# Patient Record
Sex: Female | Born: 1945 | Race: White | Hispanic: No | Smoking: Former smoker
Health system: Southern US, Community
[De-identification: ages and names within clinical notes are randomized; demographics above are authoritative.]

## PROBLEM LIST (undated history)

## (undated) DIAGNOSIS — M1990 Unspecified osteoarthritis, unspecified site: Secondary | ICD-10-CM

## (undated) DIAGNOSIS — H9192 Unspecified hearing loss, left ear: Secondary | ICD-10-CM

## (undated) DIAGNOSIS — K5790 Diverticulosis of intestine, part unspecified, without perforation or abscess without bleeding: Secondary | ICD-10-CM

## (undated) DIAGNOSIS — M797 Fibromyalgia: Secondary | ICD-10-CM

## (undated) DIAGNOSIS — E039 Hypothyroidism, unspecified: Secondary | ICD-10-CM

## (undated) DIAGNOSIS — I82409 Acute embolism and thrombosis of unspecified deep veins of unspecified lower extremity: Secondary | ICD-10-CM

## (undated) DIAGNOSIS — M5126 Other intervertebral disc displacement, lumbar region: Secondary | ICD-10-CM

## (undated) DIAGNOSIS — B1910 Unspecified viral hepatitis B without hepatic coma: Secondary | ICD-10-CM

## (undated) DIAGNOSIS — M533 Sacrococcygeal disorders, not elsewhere classified: Secondary | ICD-10-CM

## (undated) HISTORY — DX: Diverticulosis of intestine, part unspecified, without perforation or abscess without bleeding: K57.90

## (undated) HISTORY — PX: REDUCTION MAMMAPLASTY: SUR839

## (undated) HISTORY — PX: VARICOSE VEIN SURGERY: SHX832

## (undated) HISTORY — PX: LAPAROSCOPIC GASTRIC BANDING: SHX1100

## (undated) HISTORY — DX: Other intervertebral disc displacement, lumbar region: M51.26

## (undated) HISTORY — DX: Unspecified viral hepatitis B without hepatic coma: B19.10

## (undated) HISTORY — PX: DILATION AND CURETTAGE OF UTERUS: SHX78

## (undated) HISTORY — PX: BUNIONECTOMY: SHX129

## (undated) HISTORY — PX: BREAST EXCISIONAL BIOPSY: SUR124

## (undated) HISTORY — DX: Unspecified hearing loss, left ear: H91.92

## (undated) HISTORY — DX: Sacrococcygeal disorders, not elsewhere classified: M53.3

## (undated) HISTORY — PX: TONSILLECTOMY: SUR1361

---

## 1967-02-21 HISTORY — PX: BREAST BIOPSY: SHX20

## 1967-03-24 HISTORY — PX: EXCISION MORTON'S NEUROMA: SHX5013

## 1968-02-21 DIAGNOSIS — B1910 Unspecified viral hepatitis B without hepatic coma: Secondary | ICD-10-CM

## 1968-02-21 HISTORY — DX: Unspecified viral hepatitis B without hepatic coma: B19.10

## 1971-02-21 HISTORY — PX: VAGINAL HYSTERECTOMY: SUR661

## 1972-02-21 HISTORY — PX: OTHER SURGICAL HISTORY: SHX169

## 1988-02-21 DIAGNOSIS — M5126 Other intervertebral disc displacement, lumbar region: Secondary | ICD-10-CM

## 1988-02-21 HISTORY — DX: Other intervertebral disc displacement, lumbar region: M51.26

## 1989-02-20 DIAGNOSIS — M533 Sacrococcygeal disorders, not elsewhere classified: Secondary | ICD-10-CM

## 1989-02-20 HISTORY — DX: Sacrococcygeal disorders, not elsewhere classified: M53.3

## 1997-07-07 ENCOUNTER — Encounter: Admission: RE | Admit: 1997-07-07 | Discharge: 1997-07-07 | Payer: Self-pay | Admitting: Family Medicine

## 1998-02-01 ENCOUNTER — Encounter: Admission: RE | Admit: 1998-02-01 | Discharge: 1998-02-01 | Payer: Self-pay | Admitting: Family Medicine

## 1998-02-02 ENCOUNTER — Encounter: Admission: RE | Admit: 1998-02-02 | Discharge: 1998-02-02 | Payer: Self-pay | Admitting: Family Medicine

## 1998-06-21 HISTORY — PX: CHOLECYSTECTOMY: SHX55

## 1998-06-28 ENCOUNTER — Emergency Department (HOSPITAL_COMMUNITY): Admission: EM | Admit: 1998-06-28 | Discharge: 1998-06-28 | Payer: Self-pay | Admitting: Emergency Medicine

## 1998-06-29 ENCOUNTER — Observation Stay (HOSPITAL_COMMUNITY): Admission: RE | Admit: 1998-06-29 | Discharge: 1998-06-30 | Payer: Self-pay | Admitting: General Surgery

## 1999-03-09 ENCOUNTER — Encounter: Admission: RE | Admit: 1999-03-09 | Discharge: 1999-03-09 | Payer: Self-pay | Admitting: Family Medicine

## 1999-03-30 ENCOUNTER — Encounter: Admission: RE | Admit: 1999-03-30 | Discharge: 1999-03-30 | Payer: Self-pay | Admitting: Family Medicine

## 1999-09-05 ENCOUNTER — Ambulatory Visit (HOSPITAL_COMMUNITY): Admission: RE | Admit: 1999-09-05 | Discharge: 1999-09-05 | Payer: Self-pay | Admitting: *Deleted

## 1999-09-05 ENCOUNTER — Encounter: Admission: RE | Admit: 1999-09-05 | Discharge: 1999-09-05 | Payer: Self-pay | Admitting: Family Medicine

## 1999-09-09 ENCOUNTER — Encounter: Admission: RE | Admit: 1999-09-09 | Discharge: 1999-09-09 | Payer: Self-pay | Admitting: Sports Medicine

## 1999-09-20 ENCOUNTER — Encounter: Admission: RE | Admit: 1999-09-20 | Discharge: 1999-09-20 | Payer: Self-pay | Admitting: Family Medicine

## 1999-09-27 ENCOUNTER — Encounter: Admission: RE | Admit: 1999-09-27 | Discharge: 1999-09-27 | Payer: Self-pay | Admitting: Family Medicine

## 1999-09-27 ENCOUNTER — Ambulatory Visit (HOSPITAL_COMMUNITY): Admission: RE | Admit: 1999-09-27 | Discharge: 1999-09-27 | Payer: Self-pay | Admitting: Obstetrics & Gynecology

## 1999-10-04 ENCOUNTER — Encounter: Admission: RE | Admit: 1999-10-04 | Discharge: 1999-10-04 | Payer: Self-pay | Admitting: Family Medicine

## 1999-10-17 ENCOUNTER — Encounter: Admission: RE | Admit: 1999-10-17 | Discharge: 1999-10-17 | Payer: Self-pay | Admitting: Sports Medicine

## 1999-11-14 ENCOUNTER — Encounter: Admission: RE | Admit: 1999-11-14 | Discharge: 1999-11-14 | Payer: Self-pay | Admitting: Family Medicine

## 1999-12-14 ENCOUNTER — Encounter: Admission: RE | Admit: 1999-12-14 | Discharge: 1999-12-14 | Payer: Self-pay | Admitting: Family Medicine

## 2000-07-24 ENCOUNTER — Encounter: Admission: RE | Admit: 2000-07-24 | Discharge: 2000-07-24 | Payer: Self-pay | Admitting: Family Medicine

## 2001-07-31 ENCOUNTER — Encounter: Admission: RE | Admit: 2001-07-31 | Discharge: 2001-07-31 | Payer: Self-pay | Admitting: Family Medicine

## 2001-09-03 ENCOUNTER — Ambulatory Visit (HOSPITAL_COMMUNITY): Admission: RE | Admit: 2001-09-03 | Discharge: 2001-09-03 | Payer: Self-pay | Admitting: Family Medicine

## 2001-09-03 ENCOUNTER — Encounter: Admission: RE | Admit: 2001-09-03 | Discharge: 2001-09-03 | Payer: Self-pay | Admitting: Family Medicine

## 2001-10-09 ENCOUNTER — Encounter: Admission: RE | Admit: 2001-10-09 | Discharge: 2001-10-09 | Payer: Self-pay | Admitting: Family Medicine

## 2001-10-14 ENCOUNTER — Encounter (INDEPENDENT_AMBULATORY_CARE_PROVIDER_SITE_OTHER): Payer: Self-pay | Admitting: *Deleted

## 2001-10-14 ENCOUNTER — Encounter: Admission: RE | Admit: 2001-10-14 | Discharge: 2001-10-14 | Payer: Self-pay | Admitting: Family Medicine

## 2002-09-23 ENCOUNTER — Encounter: Admission: RE | Admit: 2002-09-23 | Discharge: 2002-09-23 | Payer: Self-pay | Admitting: Family Medicine

## 2002-12-16 ENCOUNTER — Encounter: Admission: RE | Admit: 2002-12-16 | Discharge: 2002-12-16 | Payer: Self-pay | Admitting: Sports Medicine

## 2002-12-18 ENCOUNTER — Encounter: Admission: RE | Admit: 2002-12-18 | Discharge: 2002-12-18 | Payer: Self-pay | Admitting: Sports Medicine

## 2003-03-19 ENCOUNTER — Encounter: Admission: RE | Admit: 2003-03-19 | Discharge: 2003-03-19 | Payer: Self-pay | Admitting: Family Medicine

## 2003-03-25 ENCOUNTER — Encounter: Admission: RE | Admit: 2003-03-25 | Discharge: 2003-03-25 | Payer: Self-pay | Admitting: Family Medicine

## 2003-07-22 DIAGNOSIS — K5790 Diverticulosis of intestine, part unspecified, without perforation or abscess without bleeding: Secondary | ICD-10-CM

## 2003-07-22 HISTORY — DX: Diverticulosis of intestine, part unspecified, without perforation or abscess without bleeding: K57.90

## 2003-10-16 ENCOUNTER — Encounter: Admission: RE | Admit: 2003-10-16 | Discharge: 2003-10-16 | Payer: Self-pay | Admitting: Family Medicine

## 2003-10-27 ENCOUNTER — Ambulatory Visit: Payer: Self-pay | Admitting: Family Medicine

## 2004-01-22 ENCOUNTER — Ambulatory Visit: Payer: Self-pay | Admitting: Family Medicine

## 2004-06-03 ENCOUNTER — Ambulatory Visit: Payer: Self-pay | Admitting: Family Medicine

## 2004-06-09 ENCOUNTER — Ambulatory Visit (HOSPITAL_COMMUNITY): Admission: RE | Admit: 2004-06-09 | Discharge: 2004-06-09 | Payer: Self-pay | Admitting: Plastic Surgery

## 2004-06-09 ENCOUNTER — Encounter (INDEPENDENT_AMBULATORY_CARE_PROVIDER_SITE_OTHER): Payer: Self-pay | Admitting: *Deleted

## 2004-06-09 ENCOUNTER — Ambulatory Visit (HOSPITAL_BASED_OUTPATIENT_CLINIC_OR_DEPARTMENT_OTHER): Admission: RE | Admit: 2004-06-09 | Discharge: 2004-06-09 | Payer: Self-pay | Admitting: Plastic Surgery

## 2004-11-29 ENCOUNTER — Ambulatory Visit: Payer: Self-pay | Admitting: Sports Medicine

## 2005-06-08 ENCOUNTER — Encounter: Admission: RE | Admit: 2005-06-08 | Discharge: 2005-08-03 | Payer: Self-pay | Admitting: Sports Medicine

## 2005-10-21 ENCOUNTER — Encounter (INDEPENDENT_AMBULATORY_CARE_PROVIDER_SITE_OTHER): Payer: Self-pay | Admitting: *Deleted

## 2005-10-21 LAB — CONVERTED CEMR LAB

## 2005-11-10 ENCOUNTER — Ambulatory Visit: Payer: Self-pay | Admitting: Family Medicine

## 2005-11-17 ENCOUNTER — Ambulatory Visit: Payer: Self-pay | Admitting: Family Medicine

## 2005-11-17 HISTORY — PX: BREAST REDUCTION SURGERY: SHX8

## 2005-11-17 HISTORY — PX: TOTAL KNEE ARTHROPLASTY: SHX125

## 2006-02-20 HISTORY — PX: KNEE ARTHROSCOPY: SUR90

## 2006-04-19 DIAGNOSIS — IMO0001 Reserved for inherently not codable concepts without codable children: Secondary | ICD-10-CM | POA: Insufficient documentation

## 2006-04-19 DIAGNOSIS — H9190 Unspecified hearing loss, unspecified ear: Secondary | ICD-10-CM | POA: Insufficient documentation

## 2006-04-19 DIAGNOSIS — E039 Hypothyroidism, unspecified: Secondary | ICD-10-CM | POA: Insufficient documentation

## 2006-04-19 DIAGNOSIS — E785 Hyperlipidemia, unspecified: Secondary | ICD-10-CM | POA: Insufficient documentation

## 2006-04-20 ENCOUNTER — Encounter (INDEPENDENT_AMBULATORY_CARE_PROVIDER_SITE_OTHER): Payer: Self-pay | Admitting: *Deleted

## 2006-12-31 ENCOUNTER — Encounter: Payer: Self-pay | Admitting: Family Medicine

## 2007-01-21 ENCOUNTER — Telehealth: Payer: Self-pay | Admitting: Family Medicine

## 2007-01-22 ENCOUNTER — Ambulatory Visit: Payer: Self-pay | Admitting: Family Medicine

## 2007-01-22 LAB — CONVERTED CEMR LAB
ALT: 16 units/L (ref 0–35)
AST: 16 units/L (ref 0–37)
Albumin: 4.2 g/dL (ref 3.5–5.2)
Alkaline Phosphatase: 96 units/L (ref 39–117)
BUN: 21 mg/dL (ref 6–23)
CO2: 27 meq/L (ref 19–32)
Calcium: 9.4 mg/dL (ref 8.4–10.5)
Chloride: 106 meq/L (ref 96–112)
Cholesterol: 200 mg/dL (ref 0–200)
Creatinine, Ser: 0.71 mg/dL (ref 0.40–1.20)
Glucose, Bld: 123 mg/dL — ABNORMAL HIGH (ref 70–99)
HCT: 39.4 % (ref 36.0–46.0)
HDL: 53 mg/dL (ref >39)
Hemoglobin: 12.4 g/dL (ref 12.0–15.0)
LDL Cholesterol: 114 mg/dL — ABNORMAL HIGH (ref 0–99)
MCHC: 31.5 g/dL (ref 30.0–36.0)
MCV: 88.7 fL (ref 78.0–100.0)
Platelets: 170 10*3/uL (ref 150–400)
Potassium: 4.2 meq/L (ref 3.5–5.3)
RBC: 4.44 M/uL (ref 3.87–5.11)
RDW: 14.5 % (ref 11.5–15.5)
Sodium: 143 meq/L (ref 135–145)
TSH: 0.993 microintl units/mL (ref 0.350–5.50)
Total Bilirubin: 0.5 mg/dL (ref 0.3–1.2)
Total CHOL/HDL Ratio: 3.8
Total Protein: 6.6 g/dL (ref 6.0–8.3)
Triglycerides: 164 mg/dL — ABNORMAL HIGH (ref <150)
VLDL: 33 mg/dL (ref 0–40)
WBC: 5.6 10*3/uL (ref 4.0–10.5)

## 2007-01-23 ENCOUNTER — Encounter: Payer: Self-pay | Admitting: Family Medicine

## 2007-01-25 ENCOUNTER — Ambulatory Visit: Payer: Self-pay | Admitting: Family Medicine

## 2007-03-19 ENCOUNTER — Encounter: Payer: Self-pay | Admitting: Family Medicine

## 2007-08-26 ENCOUNTER — Telehealth: Payer: Self-pay | Admitting: *Deleted

## 2007-09-23 ENCOUNTER — Encounter: Payer: Self-pay | Admitting: Family Medicine

## 2008-01-22 ENCOUNTER — Encounter: Payer: Self-pay | Admitting: Family Medicine

## 2008-01-22 ENCOUNTER — Ambulatory Visit: Payer: Self-pay | Admitting: Family Medicine

## 2008-01-22 LAB — CONVERTED CEMR LAB
ALT: 24 units/L (ref 0–35)
AST: 19 units/L (ref 0–37)
Albumin: 4.5 g/dL (ref 3.5–5.2)
Alkaline Phosphatase: 93 units/L (ref 39–117)
BUN: 21 mg/dL (ref 6–23)
CO2: 25 meq/L (ref 19–32)
Calcium: 9.7 mg/dL (ref 8.4–10.5)
Chloride: 103 meq/L (ref 96–112)
Cholesterol: 221 mg/dL — ABNORMAL HIGH (ref 0–200)
Creatinine, Ser: 0.71 mg/dL (ref 0.40–1.20)
Glucose, Bld: 119 mg/dL — ABNORMAL HIGH (ref 70–99)
HCT: 40.7 % (ref 36.0–46.0)
HDL: 44 mg/dL (ref >39)
Hemoglobin: 13.1 g/dL (ref 12.0–15.0)
Hgb A1c MFr Bld: 6.3 %
LDL Cholesterol: 99 mg/dL (ref 0–99)
MCHC: 32.2 g/dL (ref 30.0–36.0)
MCV: 90.2 fL (ref 78.0–100.0)
Platelets: 179 10*3/uL (ref 150–400)
Potassium: 4.3 meq/L (ref 3.5–5.3)
RBC: 4.51 M/uL (ref 3.87–5.11)
RDW: 14.6 % (ref 11.5–15.5)
Sodium: 139 meq/L (ref 135–145)
TSH: 3.253 microintl units/mL (ref 0.350–4.50)
Total Bilirubin: 0.6 mg/dL (ref 0.3–1.2)
Total CHOL/HDL Ratio: 5
Total Protein: 6.8 g/dL (ref 6.0–8.3)
Triglycerides: 392 mg/dL — ABNORMAL HIGH (ref <150)
VLDL: 78 mg/dL — ABNORMAL HIGH (ref 0–40)
WBC: 6.1 10*3/uL (ref 4.0–10.5)

## 2008-01-27 ENCOUNTER — Ambulatory Visit: Payer: Self-pay | Admitting: Family Medicine

## 2008-01-27 ENCOUNTER — Encounter: Payer: Self-pay | Admitting: Family Medicine

## 2008-01-27 DIAGNOSIS — I10 Essential (primary) hypertension: Secondary | ICD-10-CM | POA: Insufficient documentation

## 2008-03-06 ENCOUNTER — Encounter: Payer: Self-pay | Admitting: Family Medicine

## 2008-03-09 ENCOUNTER — Encounter: Admission: RE | Admit: 2008-03-09 | Discharge: 2008-06-07 | Payer: Self-pay | Admitting: Surgery

## 2008-03-16 ENCOUNTER — Encounter: Payer: Self-pay | Admitting: Family Medicine

## 2008-03-17 ENCOUNTER — Ambulatory Visit (HOSPITAL_COMMUNITY): Admission: RE | Admit: 2008-03-17 | Discharge: 2008-03-17 | Payer: Self-pay | Admitting: Surgery

## 2008-03-24 ENCOUNTER — Ambulatory Visit (HOSPITAL_COMMUNITY): Admission: RE | Admit: 2008-03-24 | Discharge: 2008-03-24 | Payer: Self-pay | Admitting: Surgery

## 2008-03-31 ENCOUNTER — Encounter (INDEPENDENT_AMBULATORY_CARE_PROVIDER_SITE_OTHER): Payer: Self-pay | Admitting: Gastroenterology

## 2008-03-31 ENCOUNTER — Ambulatory Visit (HOSPITAL_COMMUNITY): Admission: RE | Admit: 2008-03-31 | Discharge: 2008-03-31 | Payer: Self-pay | Admitting: Gastroenterology

## 2008-04-08 ENCOUNTER — Encounter: Payer: Self-pay | Admitting: Family Medicine

## 2008-05-08 ENCOUNTER — Encounter: Payer: Self-pay | Admitting: Family Medicine

## 2008-05-12 ENCOUNTER — Ambulatory Visit (HOSPITAL_COMMUNITY): Admission: RE | Admit: 2008-05-12 | Discharge: 2008-05-12 | Payer: Self-pay | Admitting: Surgery

## 2008-07-14 ENCOUNTER — Encounter: Admission: RE | Admit: 2008-07-14 | Discharge: 2008-10-12 | Payer: Self-pay | Admitting: Surgery

## 2008-07-15 ENCOUNTER — Encounter: Payer: Self-pay | Admitting: Family Medicine

## 2008-09-09 ENCOUNTER — Encounter: Payer: Self-pay | Admitting: Family Medicine

## 2008-10-16 ENCOUNTER — Telehealth: Payer: Self-pay | Admitting: Family Medicine

## 2008-10-16 ENCOUNTER — Emergency Department (HOSPITAL_COMMUNITY): Admission: EM | Admit: 2008-10-16 | Discharge: 2008-10-16 | Payer: Self-pay | Admitting: Family Medicine

## 2008-10-19 ENCOUNTER — Encounter (INDEPENDENT_AMBULATORY_CARE_PROVIDER_SITE_OTHER): Payer: Self-pay | Admitting: Family Medicine

## 2008-10-19 ENCOUNTER — Ambulatory Visit: Payer: Self-pay | Admitting: Family Medicine

## 2008-10-21 ENCOUNTER — Ambulatory Visit: Payer: Self-pay | Admitting: Family Medicine

## 2008-10-21 ENCOUNTER — Encounter: Payer: Self-pay | Admitting: Family Medicine

## 2008-10-21 LAB — CONVERTED CEMR LAB
BUN: 12 mg/dL (ref 6–23)
CO2: 25 meq/L (ref 19–32)
Calcium: 9 mg/dL (ref 8.4–10.5)
Chloride: 108 meq/L (ref 96–112)
Cholesterol: 130 mg/dL (ref 0–200)
Creatinine, Ser: 0.62 mg/dL (ref 0.40–1.20)
Glucose, Bld: 96 mg/dL (ref 70–99)
HDL: 33 mg/dL — ABNORMAL LOW (ref >39)
LDL Cholesterol: 50 mg/dL (ref 0–99)
Potassium: 3.9 meq/L (ref 3.5–5.3)
Sodium: 144 meq/L (ref 135–145)
Total CHOL/HDL Ratio: 3.9
Triglycerides: 233 mg/dL — ABNORMAL HIGH (ref <150)
VLDL: 47 mg/dL — ABNORMAL HIGH (ref 0–40)

## 2008-10-23 ENCOUNTER — Encounter: Payer: Self-pay | Admitting: Family Medicine

## 2008-10-27 ENCOUNTER — Telehealth: Payer: Self-pay | Admitting: Family Medicine

## 2008-11-12 ENCOUNTER — Encounter: Admission: RE | Admit: 2008-11-12 | Discharge: 2008-11-12 | Payer: Self-pay | Admitting: Surgery

## 2009-02-01 ENCOUNTER — Telehealth: Payer: Self-pay | Admitting: Family Medicine

## 2009-02-02 ENCOUNTER — Encounter: Payer: Self-pay | Admitting: Family Medicine

## 2009-02-09 ENCOUNTER — Telehealth (INDEPENDENT_AMBULATORY_CARE_PROVIDER_SITE_OTHER): Payer: Self-pay | Admitting: Family Medicine

## 2009-05-03 ENCOUNTER — Ambulatory Visit: Payer: Self-pay | Admitting: Family Medicine

## 2009-05-03 ENCOUNTER — Telehealth: Payer: Self-pay | Admitting: Family Medicine

## 2009-05-03 LAB — CONVERTED CEMR LAB
ALT: 16 units/L (ref 0–35)
AST: 17 units/L (ref 0–37)
Albumin: 4 g/dL (ref 3.5–5.2)
Alkaline Phosphatase: 80 units/L (ref 39–117)
BUN: 18 mg/dL (ref 6–23)
CO2: 27 meq/L (ref 19–32)
Calcium: 9.3 mg/dL (ref 8.4–10.5)
Chloride: 105 meq/L (ref 96–112)
Creatinine, Ser: 0.63 mg/dL (ref 0.40–1.20)
Direct LDL: 89 mg/dL
Glucose, Bld: 92 mg/dL (ref 70–99)
HCT: 37.1 % (ref 36.0–46.0)
Hemoglobin: 11.8 g/dL — ABNORMAL LOW (ref 12.0–15.0)
MCHC: 31.8 g/dL (ref 30.0–36.0)
MCV: 88.5 fL (ref 78.0–100.0)
Platelets: 158 10*3/uL (ref 150–400)
Potassium: 4.2 meq/L (ref 3.5–5.3)
RBC: 4.19 M/uL (ref 3.87–5.11)
RDW: 14.9 % (ref 11.5–15.5)
Sodium: 143 meq/L (ref 135–145)
TSH: 0.558 microintl units/mL (ref 0.350–4.500)
Total Bilirubin: 0.6 mg/dL (ref 0.3–1.2)
Total Protein: 6.1 g/dL (ref 6.0–8.3)
WBC: 4.8 10*3/uL (ref 4.0–10.5)

## 2009-05-07 ENCOUNTER — Encounter: Payer: Self-pay | Admitting: Family Medicine

## 2009-05-25 ENCOUNTER — Ambulatory Visit: Payer: Self-pay | Admitting: Family Medicine

## 2009-05-25 DIAGNOSIS — R634 Abnormal weight loss: Secondary | ICD-10-CM | POA: Insufficient documentation

## 2009-05-25 LAB — CONVERTED CEMR LAB
HCT: 41.8 % (ref 36.0–46.0)
Hemoglobin: 13 g/dL (ref 12.0–15.0)
Iron: 58 ug/dL (ref 42–145)
MCHC: 31.1 g/dL (ref 30.0–36.0)
MCV: 90.9 fL (ref 78.0–100.0)
Platelets: 194 10*3/uL (ref 150–400)
RBC: 4.6 M/uL (ref 3.87–5.11)
RDW: 14.9 % (ref 11.5–15.5)
Retic Ct Pct: 1 % (ref 0.4–3.1)
Saturation Ratios: 17 % — ABNORMAL LOW (ref 20–55)
TIBC: 338 ug/dL (ref 250–470)
UIBC: 280 ug/dL
WBC: 6.1 10*3/uL (ref 4.0–10.5)

## 2009-05-26 ENCOUNTER — Encounter: Payer: Self-pay | Admitting: Family Medicine

## 2009-06-04 ENCOUNTER — Encounter: Payer: Self-pay | Admitting: Family Medicine

## 2009-06-15 ENCOUNTER — Encounter: Payer: Self-pay | Admitting: Family Medicine

## 2009-07-10 ENCOUNTER — Encounter: Payer: Self-pay | Admitting: Family Medicine

## 2009-10-12 ENCOUNTER — Telehealth: Payer: Self-pay | Admitting: Family Medicine

## 2009-10-12 DIAGNOSIS — G4730 Sleep apnea, unspecified: Secondary | ICD-10-CM | POA: Insufficient documentation

## 2009-10-14 ENCOUNTER — Encounter: Payer: Self-pay | Admitting: *Deleted

## 2009-10-26 ENCOUNTER — Encounter: Payer: Self-pay | Admitting: Family Medicine

## 2009-10-26 ENCOUNTER — Ambulatory Visit (HOSPITAL_BASED_OUTPATIENT_CLINIC_OR_DEPARTMENT_OTHER): Admission: RE | Admit: 2009-10-26 | Discharge: 2009-10-26 | Payer: Self-pay | Admitting: Family Medicine

## 2009-10-30 ENCOUNTER — Ambulatory Visit: Payer: Self-pay | Admitting: Internal Medicine

## 2009-11-11 ENCOUNTER — Encounter: Payer: Self-pay | Admitting: Family Medicine

## 2010-01-08 ENCOUNTER — Encounter: Payer: Self-pay | Admitting: Family Medicine

## 2010-01-08 DIAGNOSIS — K5730 Diverticulosis of large intestine without perforation or abscess without bleeding: Secondary | ICD-10-CM | POA: Insufficient documentation

## 2010-03-22 NOTE — Assessment & Plan Note (Signed)
Summary: prob List rev    Past Medical History:    coccydynia 10/91    Hepatitis B 1970     L 4-5 HNP 11/90    Diverticulosis, colon

## 2010-03-22 NOTE — Miscellaneous (Signed)
Summary: re: prior auth  Clinical Lists Changes called pt lmom to return call. pt needs to call her insurance company and ask if sleep study needs to be prior approved and have insurance fax form to our office.Tessie Fass CMA  October 14, 2009 3:54 PM  spoke with pt she is going to call insurance and let us know if the sleep study needs prior auth and have them fax form.Molly Maduro [facility] CMA  October 14, 2009 4:52 PM    Appended Document: re: prior auth sleep study DOES NOT need prior approval per pt. will fax order to sleep center.  Appended Document: re: prior auth appt at sleep center is 11/11/09 at 8pm. LM for pt to call back so I can tell her this  Appended Document: re: prior auth pt states she already knows about this appt

## 2010-03-22 NOTE — Letter (Signed)
Summary: Results Follow-up Letter  [facility]  [address]   [place], [place] [address]   Phone: [phone]  Fax: [phone]    05/07/2009  [address] [place], [place]  [address]  Dear Ms. Hett,   The following are the results of your recent test(s): Patient: Jean Dean Your labs are normal except for the borderline anemia which we can discuss when you come in. Tests: (1) CBC NO Diff (Complete Blood Count) (10000)   Order Note: FASTING   WBC                       4.8 K/uL                    4.0-10.5   RBC                       4.19 MIL/uL                 3.87-5.11   Hemoglobin           [L]  11.8 g/dL                   03.4-74.2   Hematocrit                37.1 %                      36.0-46.0   MCV                       88.5 fL                     78.0-100.0 ! MCH                       28.2 pg                     26.0-34.0   MCHC                      31.8 g/dL                   59.5-63.8   RDW                       14.9 %                      11.5-15.5   Platelet Count            158 K/uL                    150-400     *** Please note the addition of MCH. ***  Tests: (2) Comprehensive Metabolic Panel (75643)   Sodium                    143 mEq/L                   135-145   Potassium                 4.2 mEq/L                   3.5-5.3   Chloride  105 mEq/L                   96-112   CO2                       27 mEq/L                    19-32   Glucose                   92 mg/dL                    16-10   BUN                       18 mg/dL                    9-60   Creatinine                0.63 mg/dL                  0.40-1.20   Bilirubin, Total          0.6 mg/dL                   4.5-4.0   Alkaline Phosphatase      80 U/L                      39-117   AST/SGOT                  17 U/L                      0-37   ALT/SGPT                  16 U/L                      0-35   Total Protein             6.1 g/dL                     9.8-1.1   Albumin                   4.0 g/dL                    9.1-4.7   Calcium                   9.3 mg/dL                   8.2-95.6  Tests: (3) TSH (23280)   TSH                       0.558 uIU/mL                0.350-4.500     *** Please note change in reference ranges for ages 66W to 29Y. ***     ***Test methodology is 3rd generation TSH***  Tests: (4) LDL, Direct (21308)   LDL, Direct               89 mg/dL    Document Creation Date: 05/03/2009 9:40 PM _______________________________________________________________________  Reported date-time: 05/03/2009 21:41  Sincerely,  Zachery Dauer MD [facility]           Appended Document: Results Follow-up Letter mailed.

## 2010-03-22 NOTE — Miscellaneous (Signed)
Summary: Sleep study results  Clinical Lists Changes  Sent email:  Sent sleep study report. No desat below 90% and not enough sleep interruptions to titrate mask. Asked her to try not sleeping on back and to see me if nasal congestion. If no improvement will need full nights sleep study for mask titration.

## 2010-03-22 NOTE — Progress Notes (Signed)
Summary: Labs  Phone Note Call from Patient Call back at Home Phone [phone]   Reason for Call: Talk to Nurse Summary of Call: pt wants to come in for labs so he can get a refill on her meds, refill was denied by MD due to labs. Pt coming now, wants to be sure there will be orders in her chart. Initial call taken by: Knox Royalty,  May 03, 2009 9:34 AM  New Problems: ENCOUNTER FOR LONG-TERM USE OF OTHER MEDICATIONS (ICD-V58.69)   New Problems: ENCOUNTER FOR LONG-TERM USE OF OTHER MEDICATIONS (ICD-V58.69)

## 2010-03-22 NOTE — Assessment & Plan Note (Signed)
Summary: Htn, edemakh   Vital Signs:  Patient profile:   65 year old female Height:      72.75 inches Weight:      214.8 pounds BMI:     28.64 Temp:     96.8 degrees F oral BP sitting:   112 / 65  (left arm) Cuff size:   regular  Vitals Entered By: San Morelle, SMA CC: F/u Pain Assessment Patient in pain? no        Primary Care Provider:  Zachery Dauer MD  CC:  F/u.  History of Present Illness: Recently returned from [place], [place] where she got a lot of mosquito bites.   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 She gets full quickly as expected and regurgitates if eats too rapidly. No diarrhea.   Hasn't been taking Calcium and Vitamin D.   Habits & Providers  Alcohol-Tobacco-Diet     Tobacco Status: never  Current Medications (verified): 1)  Levothroid 200 Mcg  Tabs (Levothyroxine Sodium) .... One Daily 2)  Lipitor 10 Mg Tabs (Atorvastatin Calcium) .... Take One Tablet Daily At Bedtime 3)  Paxil 20 Mg Tabs (Paroxetine Hcl) .... Take 1 Tablet By Mouth Once A Day 4)  Potassium Chloride Cr 10 Meq Cr-Caps (Potassium Chloride) .... One Daily With Hctz Prn 5)  Aspirin 325 Mg Tabs (Aspirin) .... Take One Tablet Daily 6)  Tums E-X 750 750 Mg Chew (Calcium Carbonate Antacid) .... Take 1 Tab Two Times A Day 7)  Vitamin D 400 Unit Tabs (Cholecalciferol) .... Take 2 Tabs Daily 8)  Furosemide 40 Mg Tabs (Furosemide) .... Take One Half To One Tab Daily As Needed  Allergies (verified): 1)  Biaxin (Clarithromycin)  Social History: Smoking Status:  never  Physical Exam  General:  Well, obvious weight loss Lungs:  Normal respiratory effort, chest expands symmetrically. Lungs are clear to auscultation, no crackles or wheezes. Heart:  Normal rate and regular rhythm. S1 and S2 normal without gallop, murmur, click, rub or other extra sounds. Extremities:  trace left pedal edema and trace right pedal edema.      Impression & Recommendations:  Problem # 1:  HYPERTENSION, BENIGN ESSENTIAL, MILD (ICD-401.1) At her request, changed HCTZ to furosemide 40 mg 1/2 once daily as needed for cyclic edema The following medications were removed from the medication list:    Hydrochlorothiazide 25 Mg Tabs (Hydrochlorothiazide) ..... One prn Her updated medication list for this problem includes:    Furosemide 40 Mg Tabs (Furosemide) .Marland Kitchen... Take one half to one tab daily as needed  Problem # 2:  DIARRHEA (ICD-787.91) resolved  Problem # 3:  ANEMIA (ICD-285.9)  Orders: Iron -[facility] ([address]) Iron Binding Cap (TIBC)-[facility] ([address]) Ferritin-[facility] [phone]) Retic-[facility] ([address]-69629) CBC-[facility] ([address])  Problem # 4:  HYPOTHYROIDISM, UNSPECIFIED (ICD-244.9) Replaced Her updated medication list for this problem includes:    Levothroid 200 Mcg Tabs (Levothyroxine sodium) ..... One daily  Problem # 5:  HYPERLIPIDEMIA (ICD-272.4)  Her updated medication list for this problem includes:    Lipitor 10 Mg Tabs (Atorvastatin calcium) .Marland Kitchen... Take one tablet daily at bedtime  Problem # 6:  LOSS OF WEIGHT (ICD-783.21) Therapeutic due to lap band. Consider increased risk of osteoporosis BMD  Complete Medication List: 1)  Levothroid 200 Mcg Tabs (Levothyroxine sodium) .... One daily 2)  Lipitor 10 Mg Tabs (Atorvastatin calcium) .... Take one tablet daily at bedtime 3)  Paxil 20 Mg Tabs (Paroxetine hcl) .Marland KitchenMarland KitchenMarland Kitchen  Take 1 tablet by mouth once a day 4)  Potassium Chloride Cr 10 Meq Cr-caps (Potassium chloride) .... One daily with hctz prn 5)  Aspirin 325 Mg Tabs (Aspirin) .... Take one tablet daily 6)  Tums E-x 750 750 Mg Chew (Calcium carbonate antacid) .... Take 1 tab two times a day 7)  Vitamin D 400 Unit Tabs (Cholecalciferol) .... Take 2 tabs daily 8)  Furosemide 40 Mg Tabs (Furosemide) .... Take one half to one tab daily as needed  Other Orders: [facility]- Est  Level 4 ([address]214) Vit D, 25 [facility] ([address]) Dexa scan  (Dexa scan) Pneumococcal Vaccine (09811) Admin 1st Vaccine (91478)  Patient Instructions: 1)  Please schedule a follow-up appointment in 6 months .  2)  I will send you your lab results.  Prescriptions: FUROSEMIDE 40 MG TABS (FUROSEMIDE) Take one half to one tab daily as needed  #30 x 11   Entered and Authorized by:   Zachery Dauer MD   Signed by:   Zachery Dauer MD on 05/25/2009   Method used:   Electronically to        [facility]. [phone]* (retail)       [address]       [place], [place]  [address]       Ph: [phone]       Fax: [phone]   RxID:   [id number]     Prevention & Chronic Care Immunizations   Influenza vaccine: Fluvax 3+  (11/23/2008)   Influenza vaccine due: 01/26/2009    Tetanus booster: 01/20/2001: Done.   Tetanus booster due: 01/21/2011    Pneumococcal vaccine: Pneumovax  (05/25/2009)    H. zoster vaccine: Not documented  Colorectal Screening   Hemoccult: Not documented    Colonoscopy: abnormal  (03/31/2008)   Colonoscopy due: 03/31/2018  Other Screening   Pap smear: Done.  (10/21/2005)   Pap smear due: 10/2008    Mammogram: Done.  (10/22/2003)   Mammogram due: 10/21/2004    DXA bone density scan: Not documented   DXA bone density action/deferral: Ordered  (05/25/2009)   Smoking status: never  (05/25/2009)  Lipids   Total Cholesterol: 130  (10/21/2008)   LDL: 50  (10/21/2008)   LDL Direct: 89  (05/03/2009)   HDL: 33  (10/21/2008)   Triglycerides: 233  (10/21/2008)    SGOT (AST): 17  (05/03/2009)   SGPT (ALT): 16  (05/03/2009)   Alkaline phosphatase: 80  (05/03/2009)   Total bilirubin: 0.6  (05/03/2009)    Lipid flowsheet reviewed?: Yes   Progress toward LDL goal: At goal  Hypertension   Last Blood Pressure: 112 / 65  (05/25/2009)   Serum creatinine: 0.63  (05/03/2009)   Serum potassium 4.2  (05/03/2009)    Hypertension flowsheet reviewed?: Yes   Progress toward BP goal: At  goal  Self-Management Support :   Personal Goals (by the next clinic visit) :      Personal blood pressure goal: 140/90  (05/25/2009)     Personal LDL goal: 130  (05/25/2009)    Hypertension self-management support: Not documented    Lipid self-management support: Not documented      Immunization History:  Influenza Immunization History:    Influenza:  fluvax 3+ (11/23/2008)  Immunizations Administered:  Pneumonia Vaccine:    Vaccine Type: Pneumovax    Site: left deltoid    Mfr: Merck    Dose: 0.5 ml    Route: IM  Given by: Tessie Fass CMA    Exp. Date: 01/20/2010    Lot #: [id number]    VIS given: 09/18/95 version given May 25, 2009.    Immunizations given and recorded during this visit PNEUMOVAX Pneumovax  VWU:JWJXB  LotNo:[id number]  ExpDt:01/20/2010  IM  left deltoid  by Tessie Fass CMA  VIS:09/18/95 version given May 25, 2009.

## 2010-03-22 NOTE — Assessment & Plan Note (Signed)
Summary: synthroid refill  Prescriptions: LEVOTHROID 200 MCG  TABS (LEVOTHYROXINE SODIUM) one daily  #30 Tablet x 1   Entered and Authorized by:   Zachery Dauer MD   Signed by:   Zachery Dauer MD on 05/03/2009   Method used:   Electronically to        [facility]. [phone]* (retail)       [address]       [place], [place]  [address]       Ph: [phone]       Fax: [phone]   RxID:   [id number]   Appended Document: synthroid refill Patient says she is only taking 10 mg of Lipitor, as instructed?   Clinical Lists Changes  Orders: Added new Test order of Direct LDL-[facility] [phone]) - Signed

## 2010-03-22 NOTE — Consult Note (Signed)
Summary: Sleep Study  Sleep Study   Imported By: Clydell Hakim 11/11/2009 15:48:39  _____________________________________________________________________  External Attachment:    Type:   Image     Comment:   External Document

## 2010-03-22 NOTE — Progress Notes (Signed)
Summary: request for sleep study  Phone Note Call from Patient   Caller: Patient Summary of Call: > This summer I spent a weekend sleeping in a room with several others  > and consensus was that i should get tested so I am asking you how I go  > about scheduling this. >  >  > Boneta Lucks   Follow-up for Phone Call        On Oct 08, 2009, at 8:45 AM, "Zachery Dauer" [email or web address]> wrote:  > If you tell me the symptoms, apneic episodes, snoring, daytime  > fatigue, headache, etc I can order a split study where you would spend  > the night in the sleep lab. If they demonstrated OSA they will fit you  > with a mask and determine the pressure setting so that the equipment  > can be ordered for your use. Glenice Ciccone Follow-up by: Zachery Dauer MD,  October 12, 2009 9:44 AM  Additional Follow-up for Phone Call Additional follow up Details #1::        I know I snore and my sibs say I do have some periods of apnea.  No fatigue. Occasional morning headaches  Boneta Lucks Additional Follow-up by: Zachery Dauer MD,  October 12, 2009 9:44 AM  New Problems: SLEEP APNEA (ICD-780.57)   New Problems: SLEEP APNEA (ICD-780.57)

## 2010-03-22 NOTE — Miscellaneous (Signed)
Summary: prophylaxis for [place] trip  Clinical Lists Changes She will see about getting Yellow Fever vaccine Medications: Added new medication of DOXYCYCLINE HYCLATE 100 MG CAPS (DOXYCYCLINE HYCLATE) Take one tab daily - Signed Rx of DOXYCYCLINE HYCLATE 100 MG CAPS (DOXYCYCLINE HYCLATE) Take one tab daily;  #43 x 0;  Signed;  Entered by: Zachery Dauer MD;  Authorized by: Zachery Dauer MD;  Method used: Electronically to [facility]. #[id number]*, [address] [place], [place], [place]  [address], Ph: [phone], Fax: [phone]    Prescriptions: DOXYCYCLINE HYCLATE 100 MG CAPS (DOXYCYCLINE HYCLATE) Take one tab daily  #43 x 0   Entered and Authorized by:   Zachery Dauer MD   Signed by:   Zachery Dauer MD on 07/10/2009   Method used:   Electronically to        [facility]. [phone]* (retail)       [address]       [place], [place]  [address]       Ph: [phone]       Fax: [phone]   RxID:   [id number]

## 2010-03-22 NOTE — Letter (Signed)
Summary: Results Follow-up Letter  [facility]  [address]   [place], [place] [address]   Phone: [phone]  Fax: [phone]    05/26/2009  [address] [place], [place]  [address]  Dear Ms. Maberry,   The following are the results of your recent test(s): Patient: Jean Dean  Tests: (1) Iron and IBC (2390)   Iron                      58 ug/dL                    57-846   UIBC                      280 ug/dL   TIBC                      338 ug/dL                   962-952   %SAT                 [L]  17 %                        20-55 Despite the slightly low saturation the ferritin, Hgb and MCV don't support iron deficiency, so no reason to take iron.   Tests: (2) CBC NO Diff (Complete Blood Count) (10000)   WBC                       6.1 K/uL                    4.0-10.5   RBC                       4.60 MIL/uL                 3.87-5.11   Hemoglobin                13.0 g/dL                   84.1-32.4   Hematocrit                41.8 %                      36.0-46.0   MCV                       90.9 fL                     78.0-100.0 ! MCH                       28.3 pg                     26.0-34.0   MCHC                      31.1 g/dL                   40.1-02.7   RDW                       14.9 %  11.5-15.5   Platelet Count            194 K/uL                    150-400     *** Please note the addition of MCH. ***  Tests: (3) Reticulocyte Count (RETIC) (10050)   % Retic                   1.0 %                       0.4-3.1   RBC                       4.60 MIL/uL                 3.87-5.11   ABS Retic                 46.0 K/uL                   19.0-186.0  Tests: (4) Ferritin ([address])   Ferritin                  90 ng/mL                    10-291  Tests: (5) Vitamin D (25-Hydroxy) (60[address])  Vitamin D (25-Hydroxy)                             35 ng/mL                    30-89  We'll give credit to the [place] sun for this  normal level. Document Creation Date: 05/26/2009 6:02 AM ___________________________________________________________  Sincerely,  Zachery Dauer MD [facility]           Appended Document: Results Follow-up Letter mailed.

## 2010-04-01 LAB — LIPID PANEL
HDL: 40 mg/dL (ref 35–70)
LDL Cholesterol: 72 mg/dL
LDl/HDL Ratio: 4

## 2010-04-01 LAB — BASIC METABOLIC PANEL
BUN: 16 mg/dL (ref 4–21)
Creatinine: 0.7 mg/dL (ref 0.5–1.1)
Glucose: 94 mg/dL
Potassium: 4.1 mmol/L (ref 3.4–5.3)

## 2010-04-01 LAB — HEPATIC FUNCTION PANEL
ALT: 23 U/L (ref 7–35)
AST: 21 U/L (ref 13–35)
Bilirubin, Total: 0.6 mg/dL

## 2010-04-01 LAB — CBC AND DIFFERENTIAL: WBC: 5.9 10^3/mL

## 2010-04-07 ENCOUNTER — Encounter: Payer: Self-pay | Admitting: Family Medicine

## 2010-05-02 ENCOUNTER — Encounter: Payer: Self-pay | Admitting: Home Health Services

## 2010-05-28 LAB — BASIC METABOLIC PANEL
BUN: 13 mg/dL (ref 6–23)
CO2: 25 mEq/L (ref 19–32)
Calcium: 9.1 mg/dL (ref 8.4–10.5)
Chloride: 102 mEq/L (ref 96–112)
Creatinine, Ser: 0.85 mg/dL (ref 0.4–1.2)
GFR calc Af Amer: >60 mL/min (ref >60)
GFR calc non Af Amer: >60 mL/min (ref >60)
Glucose, Bld: 95 mg/dL (ref 70–99)
Potassium: 3.3 mEq/L — ABNORMAL LOW (ref 3.5–5.1)
Sodium: 137 mEq/L (ref 135–145)

## 2010-05-28 LAB — STOOL CULTURE

## 2010-05-28 LAB — CLOSTRIDIUM DIFFICILE EIA: C difficile Toxins A+B, EIA: NEGATIVE

## 2010-05-28 LAB — CBC
HCT: 35.8 % — ABNORMAL LOW (ref 36.0–46.0)
Hemoglobin: 11.9 g/dL — ABNORMAL LOW (ref 12.0–15.0)
MCHC: 33.3 g/dL (ref 30.0–36.0)
MCV: 86.7 fL (ref 78.0–100.0)
Platelets: 167 10*3/uL (ref 150–400)
RBC: 4.13 MIL/uL (ref 3.87–5.11)
RDW: 15.2 % (ref 11.5–15.5)
WBC: 6.7 10*3/uL (ref 4.0–10.5)

## 2010-05-31 ENCOUNTER — Other Ambulatory Visit: Payer: Self-pay | Admitting: Family Medicine

## 2010-05-31 NOTE — Telephone Encounter (Signed)
Refill request

## 2010-06-02 LAB — DIFFERENTIAL
Basophils Absolute: 0 10*3/uL (ref 0.0–0.1)
Basophils Relative: 1 % (ref 0–1)
Eosinophils Absolute: 0.2 10*3/uL (ref 0.0–0.7)
Eosinophils Relative: 3 % (ref 0–5)
Lymphocytes Relative: 27 % (ref 12–46)
Lymphs Abs: 1.8 10*3/uL (ref 0.7–4.0)
Monocytes Absolute: 0.4 10*3/uL (ref 0.1–1.0)
Monocytes Relative: 6 % (ref 3–12)
Neutro Abs: 4.4 10*3/uL (ref 1.7–7.7)
Neutrophils Relative %: 64 % (ref 43–77)

## 2010-06-02 LAB — COMPREHENSIVE METABOLIC PANEL
ALT: 26 U/L (ref 0–35)
AST: 23 U/L (ref 0–37)
Albumin: 4.1 g/dL (ref 3.5–5.2)
Alkaline Phosphatase: 93 U/L (ref 39–117)
BUN: 14 mg/dL (ref 6–23)
CO2: 29 mEq/L (ref 19–32)
Calcium: 9.6 mg/dL (ref 8.4–10.5)
Chloride: 104 mEq/L (ref 96–112)
Creatinine, Ser: 0.69 mg/dL (ref 0.4–1.2)
GFR calc Af Amer: >60 mL/min (ref >60)
GFR calc non Af Amer: >60 mL/min (ref >60)
Glucose, Bld: 127 mg/dL — ABNORMAL HIGH (ref 70–99)
Potassium: 3.9 mEq/L (ref 3.5–5.1)
Sodium: 140 mEq/L (ref 135–145)
Total Bilirubin: 0.9 mg/dL (ref 0.3–1.2)
Total Protein: 6.7 g/dL (ref 6.0–8.3)

## 2010-06-02 LAB — CBC
HCT: 39.4 % (ref 36.0–46.0)
Hemoglobin: 13.1 g/dL (ref 12.0–15.0)
MCHC: 33.2 g/dL (ref 30.0–36.0)
MCV: 88.9 fL (ref 78.0–100.0)
Platelets: 178 10*3/uL (ref 150–400)
RBC: 4.43 MIL/uL (ref 3.87–5.11)
RDW: 15 % (ref 11.5–15.5)
WBC: 6.8 10*3/uL (ref 4.0–10.5)

## 2010-06-02 LAB — GLUCOSE, CAPILLARY: Glucose-Capillary: 142 mg/dL — ABNORMAL HIGH (ref 70–99)

## 2010-07-01 ENCOUNTER — Telehealth: Payer: Self-pay | Admitting: Family Medicine

## 2010-07-01 NOTE — Telephone Encounter (Signed)
Email request for refills. Replied that she needs an office visit and labs because it has been longer than a year for both.

## 2010-07-05 NOTE — Op Note (Signed)
NAMEBRITANNI, Jean Dean           ACCOUNT NO.:  [id number]   MEDICAL RECORD NO.:  [id number]          PATIENT TYPE:  AMB   LOCATION:  ENDO                         FACILITY:  [facility]   PHYSICIAN:  Bernette Redbird, M.D.   DATE OF BIRTH:  24-May-1945   DATE OF PROCEDURE:  03/31/2008  DATE OF DISCHARGE:                               OPERATIVE REPORT   SURGEON:  Bernette Redbird, MD   PROCEDURE:  Colonoscopy with biopsy.   INDICATIONS:  This is a 65 year old female with history of several  colonic adenomas removed about 8 years ago, with most recent  surveillance colonoscopy, 5 years ago, having been negative.   FINDINGS:  Several diminutive polyps.  Scattered diverticulosis.   PROCEDURE:  The procedure was familiar to the patient from prior  examination and she provided written consent.  Sedation was Phenergan  12.5 mg IV, fentanyl 100 mcg IV, and Versed 7.5 mg IV without  arrhythmias, desaturation, or clinical instability.  The Pentax adult  video colonoscope was advanced around the colon to the terminal ileum,  using some external abdominal compression to control looping.  Pullback  was then performed.  The quality of prep was excellent, and it is felt  that all areas were well seen.  In the transverse colon, was a 2-mm  sessile polyp, removed by cold biopsy.  In the rectum, there were  several diminutive flat hyperplastic-appearing polyps, a couple of which  were biopsied for sampling purposes.  No large polyps, cancer, colitis,  or vascular ectasia were noted, but there was mild-to-moderate  diverticulosis scattered through much of the colon.  It was  predominantly in the left colon and sigmoid region.   Retroflexion in the rectum and re-inspection of the rectum were  unremarkable.  The patient tolerated the procedure well, and there were  no apparent complications.   IMPRESSION:  1. Diminutive colon polyps.  2. Scattered diverticulosis.   PLAN:  Await pathology.  In view of the  prior history of colonic  adenomas and the small size of the current polyps, I would anticipate a  surveillance examination in 5 years.           ______________________________  Bernette Redbird, M.D.     RB/MEDQ  D:  03/31/2008  T:  03/31/2008  Job:  [id number]   cc:   Deniece Portela A. Sheffield Slider, M.D.  Sandria Bales. Ezzard Standing, M.D.

## 2010-07-05 NOTE — Op Note (Signed)
NAMEJILLAYNE, WITTE           ACCOUNT NO.:  [id number]   MEDICAL RECORD NO.:  [id number]          PATIENT TYPE:  AMB   LOCATION:  DAY                          FACILITY[facility]  [facility]   PHYSICIAN:  Sandria Bales. Ezzard Standing, M.D.  DATE OF BIRTH:  1945/07/08   DATE OF PROCEDURE:  05/12/2008  DATE OF DISCHARGE:                               OPERATIVE REPORT   Date of Surgery ?   PREOPERATIVE DIAGNOSIS:  Morbid obesity (Weight of 271, BMI 35.8).   POSTOPERATIVE DIAGNOSIS:  Morbid obesity (weight 271, BMI of 35.8)   PROCEDURES:  Laparoscopic band with AP standard band, sewn to fascia.   SURGEON:  Dr. Sandria Bales. Newman.   FIRST ASSISTANT:  Dr. Wenda Low.   ANESTHESIA:  General endotracheal with approximately 20 mL of 0.25%  Marcaine.   COMPLICATIONS:  None.   INDICATIONS FOR PROCEDURE:  Ms. Crumbley  is a 65 year old white female  patient who sees Luretha Murphy, Family Nurse Practitioner for Zachery Dauer,  M.D..  She is interested in a the lap band.  She has completed our preop  bariatric program.   I discussed with her the indications, potential complications of lap  band surgery.  Potential complication include but not limited bleeding,  infection, slippage and erosion of the band and the possibility of open  surgery.   OPERATIVE NARRATIVE:  The patient placed in supine position.  Her  abdomen was prepped with CHG, sterilely draped.  She was given a gram of  Ancef at the initiation of procedure and a time-out was held identifying  the patient and procedure.   I accessed her abdominal cavity through a left upper quadrant with a 11-  mm OptiVu  Ethicon trocar. I placed for 4 additional trocars.  I placed  a 5 mm trocar in the subxiphoid location for the liver tract, I placed  in 15 mm trocar in the right upper quadrant, A 12 mm trocar to the right  of midline, A 12 mm left to midline for the camera.   The abdominal exploration revealed right and left lobes of liver  unremarkable.  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  the bowel that I could see  was unremarkable, omen covering part with omentum and the stomach was  unremarkable.  I introduced the [facility] liver retractor to retract the  left lobe of liver.  She actually had kind of a multi-lobulated left  lobe, a little bit hard to retract out of the way.  It was not so big,  it was just sort of floppy.   I got to the gastroesophageal junction, I passed the sizing tube down  and blew up 15 mL of air and pulled this back.  There was no evidence of  a hiatal hernia.  The esophagus lumen was then pulled back into the  esophagus.  I then created a space, I went to left  lobe of the  gastroesophageal junction and made a small window into the right of the  gastroesophageal junction along the right crus and passed the finger  dissector behind the gastroesophageal junction and threaded an AP  standard band around.  I then I passed the sizing tube back down.  I cinched the band over the  sizing tube.  It did not seem too tight.   The sizing tube was then removed.  I then placed 3-0 Ethilon sutures as  an imbricating sutures of the stomach over the lateral aspect of the  band and used tie knots to tie these down.   A photo was taken and placed in the chart showing the orientation of the  band.   The Silastic tube was removed out through the right paramedian incision.  There was no bleeding within the abdomen.  All the trocars were then  removed in turn with no visualized evidence of bleeding.  I created a  pocket and sewed the band reservoir down to the  abdominal wall.  It was sent to the fascia with four 2-0 Prolene  sutures.   The band device lay flat, the Silastic tubing went to the abdomen  without any obvious kink.  I then closed his tissues with 3-0 Vicryl  suture and the skin each site was closed with 5-0 Vicryl suture painted  with Benzoin and Steri-Strips.   The patient tolerated the procedure well and was  transported to recovery  room good condition.   PLAN:  The plan is to let her go home today.      Sandria Bales. Ezzard Standing, M.D.  Electronically Signed     DHN/MEDQ  D:  05/12/2008  T:  05/12/2008  Job:  [id number]   cc:   Deniece Portela A. Sheffield Slider, M.D.

## 2010-07-06 ENCOUNTER — Encounter: Payer: Self-pay | Admitting: Family Medicine

## 2010-07-07 ENCOUNTER — Encounter: Payer: Self-pay | Admitting: Family Medicine

## 2010-07-07 ENCOUNTER — Ambulatory Visit (INDEPENDENT_AMBULATORY_CARE_PROVIDER_SITE_OTHER): Payer: BC Managed Care – PPO | Admitting: Family Medicine

## 2010-07-07 VITALS — BP 120/80 | HR 80 | Ht 74.0 in | Wt 221.0 lb

## 2010-07-07 DIAGNOSIS — H9190 Unspecified hearing loss, unspecified ear: Secondary | ICD-10-CM

## 2010-07-07 DIAGNOSIS — R634 Abnormal weight loss: Secondary | ICD-10-CM

## 2010-07-07 DIAGNOSIS — E785 Hyperlipidemia, unspecified: Secondary | ICD-10-CM

## 2010-07-07 DIAGNOSIS — I10 Essential (primary) hypertension: Secondary | ICD-10-CM

## 2010-07-07 DIAGNOSIS — M1710 Unilateral primary osteoarthritis, unspecified knee: Secondary | ICD-10-CM

## 2010-07-07 DIAGNOSIS — M170 Bilateral primary osteoarthritis of knee: Secondary | ICD-10-CM | POA: Insufficient documentation

## 2010-07-07 DIAGNOSIS — E039 Hypothyroidism, unspecified: Secondary | ICD-10-CM

## 2010-07-07 DIAGNOSIS — Z87410 Personal history of cervical dysplasia: Secondary | ICD-10-CM

## 2010-07-07 MED ORDER — ATORVASTATIN CALCIUM 10 MG PO TABS: 20.0000 mg | ORAL_TABLET | Freq: Every day | ORAL | Status: DC

## 2010-07-07 MED ORDER — ASPIRIN 81 MG PO CHEW: 81.0000 mg | CHEWABLE_TABLET | Freq: Every day | ORAL | Status: DC

## 2010-07-07 MED ORDER — PAROXETINE HCL 20 MG PO TABS: 20.0000 mg | ORAL_TABLET | ORAL | Status: DC

## 2010-07-07 MED ORDER — LEVOTHYROXINE SODIUM 150 MCG PO TABS: 150.0000 ug | ORAL_TABLET | Freq: Every day | ORAL | Status: DC

## 2010-07-07 MED ORDER — GLUCOSAMINE-CHONDROITIN 500-400 MG PO TABS: 1.0000 | ORAL_TABLET | Freq: Three times a day (TID) | ORAL | Status: AC

## 2010-07-07 MED ORDER — CIPROFLOXACIN HCL 500 MG PO TABS: 500.0000 mg | ORAL_TABLET | Freq: Two times a day (BID) | ORAL | Status: AC

## 2010-07-07 NOTE — Assessment & Plan Note (Addendum)
She plans to restrict calories further

## 2010-07-07 NOTE — Patient Instructions (Signed)
Recheck TSH in 3 months Recheck for office visit in 12 months.

## 2010-07-07 NOTE — Assessment & Plan Note (Signed)
well controlled  

## 2010-07-07 NOTE — Assessment & Plan Note (Addendum)
LDL of 72 below goal of under 130.  Triglycerides still elevated, we discussed restricting carbs.

## 2010-07-07 NOTE — Progress Notes (Signed)
  Subjective:    Patient ID: Jean Dean, female    DOB: 1945-11-16, 65 y.o.   MRN: [id number]  HPIShe is feeling well. Going to [place] close to [place] for 2 months with her sister where both will work as Midwife. Sister is a Garment/textile technologist at [employer]. Daughter is now 53, single, living in [place]. Area. 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 Would like Cipro for traveler's diarrhea treatment.   She's happy with her lap band result. Has regained 10 pounds. Tries not to eat too large of meals. Goal weight is 190  Occasionally wears her CPAP. Sleeping well, but snores.   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 Has started Glucosamine/Chondroiten, not sure of dose.   Had BMD testing done 2 months ago and results were good.     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        Objective:   Physical Exam  Constitutional: She appears well-developed and well-nourished.  Neck: No thyromegaly present.  Cardiovascular: Normal rate and regular rhythm.   Pulmonary/Chest: Effort normal and breath sounds normal.          Assessment & Plan:

## 2010-07-07 NOTE — Assessment & Plan Note (Signed)
TSH is too slow. We discussed osteoporosis risk. She would like to try decreasing the dose to 150 micrograms. Recheck TSH in 3 months.

## 2010-07-20 ENCOUNTER — Encounter: Payer: Self-pay | Admitting: Family Medicine

## 2010-09-12 ENCOUNTER — Other Ambulatory Visit: Payer: Self-pay | Admitting: Family Medicine

## 2010-09-12 DIAGNOSIS — E785 Hyperlipidemia, unspecified: Secondary | ICD-10-CM

## 2010-09-12 MED ORDER — ATORVASTATIN CALCIUM 10 MG PO TABS: 10.0000 mg | ORAL_TABLET | Freq: Every day | ORAL | Status: DC

## 2010-09-12 NOTE — Telephone Encounter (Signed)
Fax from pharmacy requesting clarification of Atorvastatin dose. I reduced the Rx to one 10 mg tablet daily due to her low LDL on last testing.

## 2010-09-23 IMAGING — CR DG ABDOMEN 1V
1 series · 1 of 1 positions shown · non-contrast
Comparison: Upper GI series from 03/24/2008.

CLINICAL DATA: Morbid obesity.  Gastric banding.

ABDOMEN - 1 VIEW

[t abdomen supine]
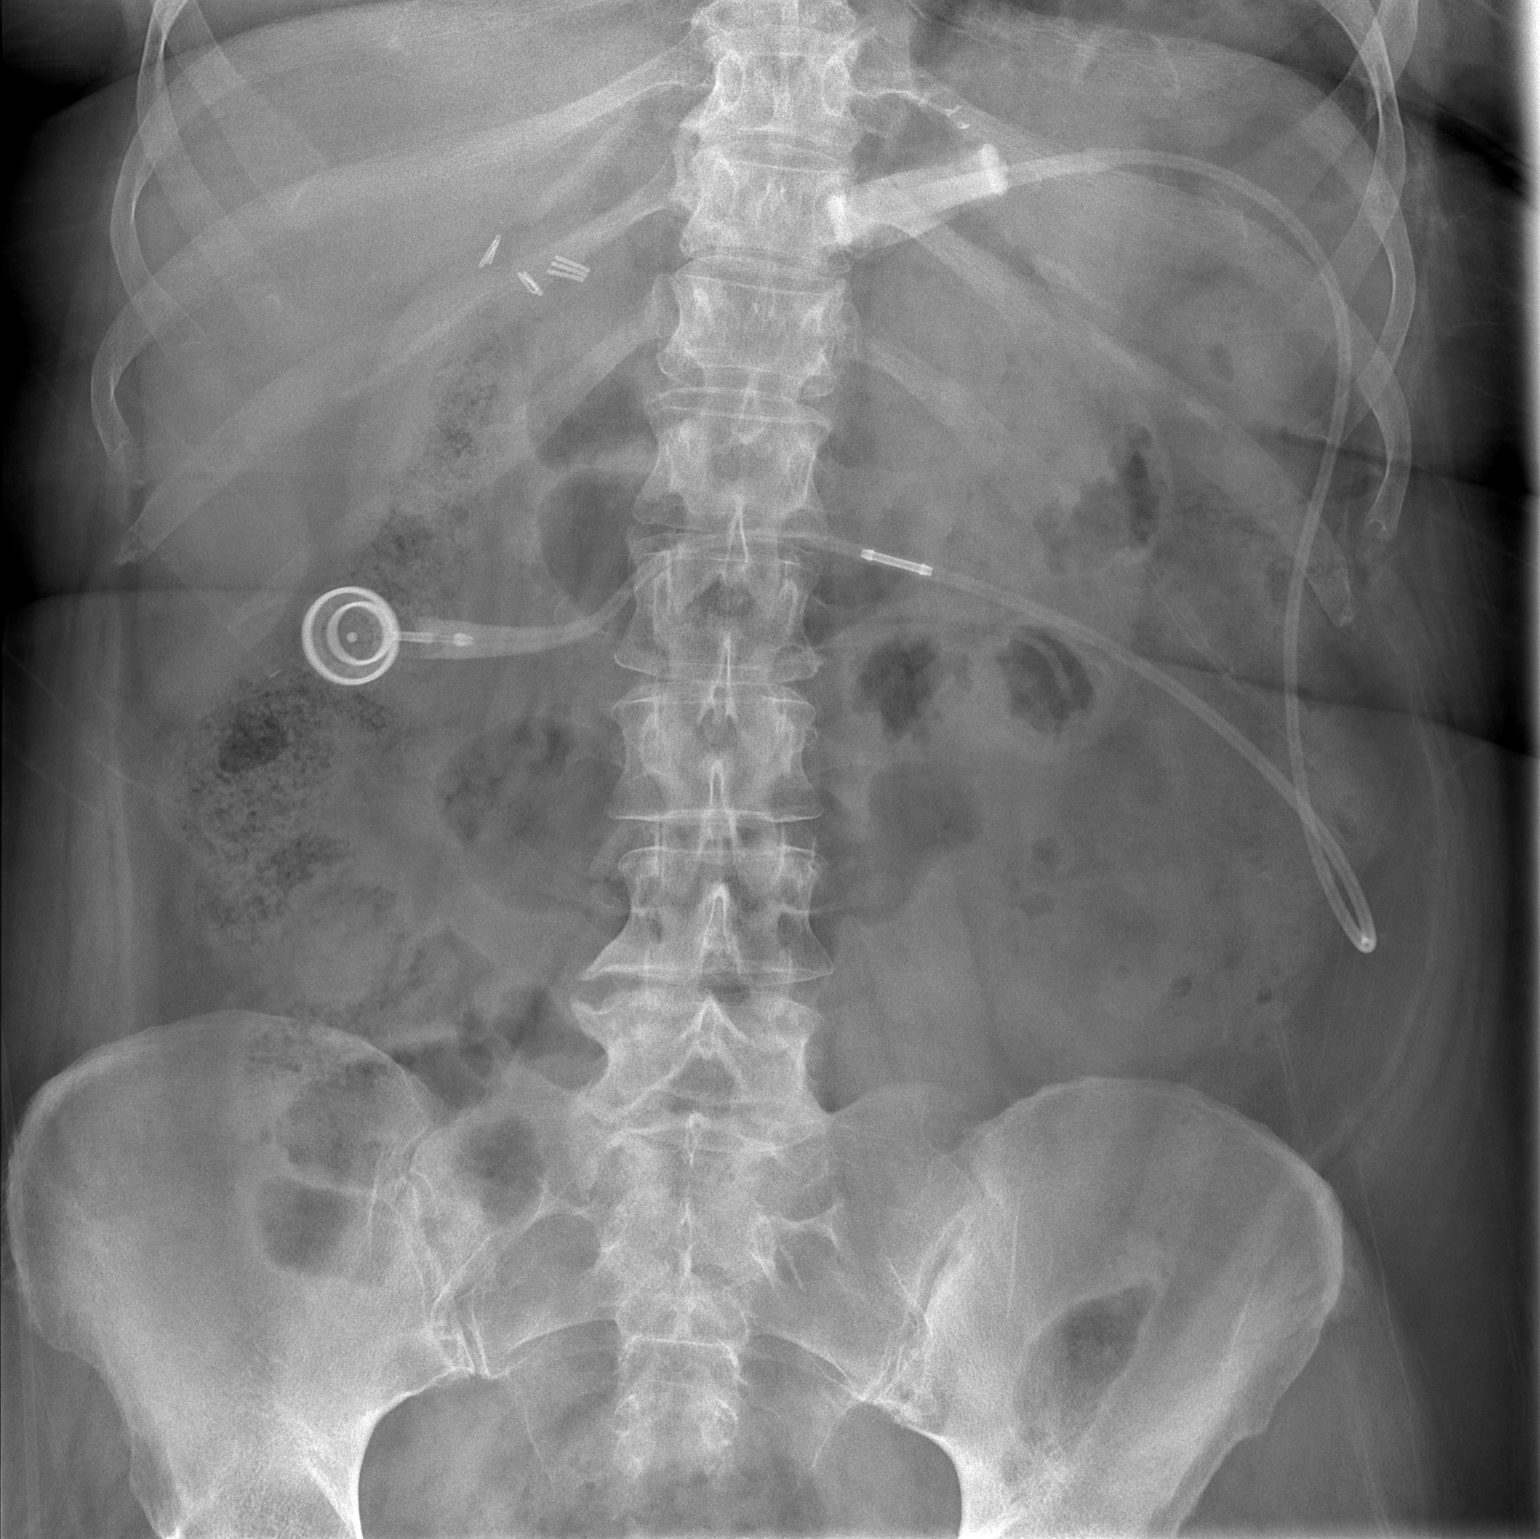

[1 of 1 positions shown; findings below may reference images not displayed]

FINDINGS: Interval placement of a lap band is noted.  The band is
oriented from the 2:30 o'clock position to the 8:30 o'clock
position.  Bowel gas pattern is normal.  The port device for the
lap band is positioned over the right abdomen.
IMPRESSION: Status post lap band placement, oriented from the 2:30 o'clock
position to the 8:30 o'clock position.

## 2010-11-17 ENCOUNTER — Telehealth: Payer: Self-pay | Admitting: *Deleted

## 2010-11-17 NOTE — Telephone Encounter (Signed)
Received call from pharmacy. Patient has been on levothroid 150 mcg and this is back ordered. No one can get it . Pharmacist needs to make sure it will be ok to switch to levothyroxine 150 mcg.  Consulted with Dr. Sheffield Slider and he advises ok. Pharmacy will notify MD when they do have in stock for  order to be switched back.

## 2011-01-02 ENCOUNTER — Telehealth: Payer: Self-pay | Admitting: Family Medicine

## 2011-01-02 DIAGNOSIS — M545 Low back pain, unspecified: Secondary | ICD-10-CM

## 2011-01-02 MED ORDER — CYCLOBENZAPRINE HCL 10 MG PO TABS: 10.0000 mg | ORAL_TABLET | Freq: Three times a day (TID) | ORAL | Status: AC | PRN

## 2011-01-02 NOTE — Telephone Encounter (Signed)
Good morning Homer and Fannie Knee,   I could barely get out of bed this morning due to severe muscle spasms across my entire lower back. It started yesterday when I leaned down to pick something up. Never picked the thing up because the pain was too great.    I looked for my old bottle to flexaril that I have taken at other times when this happens and I no longer have any.  I am quite sure  my prescription at [facility] is too old for them to be willing to fill it.     Would you be willing to call in a prescription for me?  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  If I needed to come I could probably break away but you would need to call me on my cell phone.  [phone].   I am absolutely miserable.  I tried two tramadol last night but that did nothing for the pain and I was awake all night.  When I move, the spasms are a 10/10.  When I lie still they are nothing.  I need to be able to move.  The pain is at its worst when I try to go from a lying position to a sitting and then standing position.   Cleda Daub,  I understand having had the same thing a couple years ago. I sent the [facility] in through [facility]. I'm hospital attending this week, so not in the office. Call for an appt if your don't get better quickly or have fever, kidney sx or other non-musculoskeletal sx. Going forward, I'll get the message sooner if you communicate through [facility].   Athalie Newhard A. Sheffield Slider, MD, MS via Outlook.

## 2011-06-26 ENCOUNTER — Encounter: Payer: Self-pay | Admitting: Family Medicine

## 2011-08-10 ENCOUNTER — Encounter: Payer: Self-pay | Admitting: Family Medicine

## 2011-08-10 ENCOUNTER — Other Ambulatory Visit: Payer: Self-pay | Admitting: Family Medicine

## 2011-08-10 DIAGNOSIS — E785 Hyperlipidemia, unspecified: Secondary | ICD-10-CM

## 2011-08-10 NOTE — Telephone Encounter (Signed)
She needs a TSH before further prescriptions.

## 2011-10-30 ENCOUNTER — Telehealth: Payer: Self-pay | Admitting: Family Medicine

## 2011-10-30 DIAGNOSIS — E785 Hyperlipidemia, unspecified: Secondary | ICD-10-CM

## 2011-10-30 DIAGNOSIS — E039 Hypothyroidism, unspecified: Secondary | ICD-10-CM

## 2011-10-30 DIAGNOSIS — Z79899 Other long term (current) drug therapy: Secondary | ICD-10-CM

## 2011-10-30 NOTE — Telephone Encounter (Signed)
[name],  I got your note about getting thyroid tests done before my next prescription.  I hope to get this done next week. Would you be willing to let them check my cholesterol and HbgA1c at the same time. I would plan to come Wednesday or Thursday fasting if that is OK with you  Jean Dean  I put in the orders for TSH, CMET, and fasting lipids. I don't have a diagnosis to justify and A1c. Is there a new problem?   Jean Dean A. Sheffield Slider, MD, MS

## 2011-11-02 ENCOUNTER — Other Ambulatory Visit: Payer: Self-pay | Admitting: Family Medicine

## 2011-11-02 ENCOUNTER — Other Ambulatory Visit: Payer: BC Managed Care – PPO

## 2011-11-02 DIAGNOSIS — E785 Hyperlipidemia, unspecified: Secondary | ICD-10-CM

## 2011-11-02 DIAGNOSIS — E039 Hypothyroidism, unspecified: Secondary | ICD-10-CM

## 2011-11-02 LAB — COMPREHENSIVE METABOLIC PANEL
ALT: 11 U/L (ref 0–35)
BUN: 19 mg/dL (ref 6–23)
CO2: 30 mEq/L (ref 19–32)
Calcium: 10.1 mg/dL (ref 8.4–10.5)
Chloride: 104 mEq/L (ref 96–112)
Creat: 0.69 mg/dL (ref 0.50–1.10)
Glucose, Bld: 96 mg/dL (ref 70–99)

## 2011-11-02 LAB — LIPID PANEL
Cholesterol: 188 mg/dL (ref 0–200)
Total CHOL/HDL Ratio: 3.8 Ratio
Triglycerides: 173 mg/dL — ABNORMAL HIGH (ref <150)

## 2011-11-02 MED ORDER — PAROXETINE HCL 20 MG PO TABS: 20.0000 mg | ORAL_TABLET | ORAL | Status: DC

## 2011-11-02 NOTE — Progress Notes (Signed)
CMP,FLP AND TSH DONE TODAY Jean Dean 

## 2011-11-05 ENCOUNTER — Other Ambulatory Visit: Payer: Self-pay | Admitting: Family Medicine

## 2011-11-05 ENCOUNTER — Encounter: Payer: Self-pay | Admitting: Family Medicine

## 2011-11-10 ENCOUNTER — Telehealth (INDEPENDENT_AMBULATORY_CARE_PROVIDER_SITE_OTHER): Payer: Self-pay | Admitting: Surgery

## 2011-11-10 ENCOUNTER — Encounter: Payer: Self-pay | Admitting: Family Medicine

## 2011-11-10 NOTE — Telephone Encounter (Signed)
I spoke with patient via phone and advised that it is time to schedule her 3 year follow-up visit for bariatric surgery. The patient stated she would call [facility] back to schedule an appointment as she was in a meeting at that time..Thomasene Lot

## 2011-12-07 ENCOUNTER — Other Ambulatory Visit: Payer: Self-pay | Admitting: Family Medicine

## 2011-12-07 ENCOUNTER — Encounter: Payer: Self-pay | Admitting: Family Medicine

## 2011-12-22 ENCOUNTER — Ambulatory Visit (INDEPENDENT_AMBULATORY_CARE_PROVIDER_SITE_OTHER): Payer: BC Managed Care – PPO | Admitting: Surgery

## 2011-12-22 VITALS — Ht 73.5 in | Wt 229.4 lb

## 2011-12-22 DIAGNOSIS — Z9884 Bariatric surgery status: Secondary | ICD-10-CM

## 2011-12-22 NOTE — Progress Notes (Signed)
[facility]  Ovidio Kin, MD,  FACS [address] [place]  [address] [place], [place]    [address] Phone:  [phone] FAX:  [phone]   Re:   Jean Dean DOB:   08-19-1945 MRN:   [id number]  ASSESSMENT AND PLAN: 1.  Lap Band, APS - 05/12/2008  Initial weight - 276, BMI - 35.8  Goal weight was 220  She has gained some weight since I last saw her on 04/03/2010, but she knows what she is doing and eating wrong.  She does not need a lap band adjustment.  In fact, if anything, by history she may be a little tight.  Return to see me in 1 year.  2.  History of hypercholesterolemia 3.  Hypertension, off meds 4.  Fibromyalgia 5.  Lower extremity osteoarthritis 6.  History of DVT  HISTORY OF PRESENT ILLNESS: Chief Complaint  Patient presents with  . Lap Band Fill   Jean Dean is a 66 y.o. (DOB: [birth year])  white female who is a patient of HALE,WAYNE ANDREW, MD and comes to me today for follow up for lap band.  She has gained about 20 pounds since I last saw her. But she admits that she is not eating the right foods at times.  She likes mashed potatoes.  She vomits (spits up) with pasta or bread.  She does have trouble with food at breakfast.   She also has trouble with most meats.  She is worried because she has some soreness around her port, though it is not hurting today. She has not done much with exercise, but she admits that she is lazy.  She retired just 2 weeks ago from [employer] (she did not like the leadership).  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  PHYSICAL EXAM: Ht 6' 1.5" (1.867 m)  Wt 229 lb 6.4 oz (104.055 kg)  BMI 29.86 kg/m2  Lungs:  Clear. Abdomen:  Soft.  Port in the RUQ feels normal.  There is no redness, no tenderness, no palpable abnormality.  DATA REVIEWED: None new.  Ovidio Kin, MD, FACS Office:  [phone]

## 2012-04-06 ENCOUNTER — Other Ambulatory Visit: Payer: Self-pay

## 2012-05-02 ENCOUNTER — Other Ambulatory Visit: Payer: Self-pay | Admitting: *Deleted

## 2012-05-02 MED ORDER — LEVOTHYROXINE SODIUM 150 MCG PO TABS: 150.0000 ug | ORAL_TABLET | Freq: Every day | ORAL | Status: DC

## 2012-10-30 ENCOUNTER — Ambulatory Visit (INDEPENDENT_AMBULATORY_CARE_PROVIDER_SITE_OTHER): Payer: BC Managed Care – PPO | Admitting: Surgery

## 2012-10-30 ENCOUNTER — Encounter (INDEPENDENT_AMBULATORY_CARE_PROVIDER_SITE_OTHER): Payer: Self-pay | Admitting: Surgery

## 2012-10-30 VITALS — BP 128/74 | HR 76 | Temp 97.1°F | Resp 14 | Ht 73.5 in | Wt 226.0 lb

## 2012-10-30 DIAGNOSIS — Z9884 Bariatric surgery status: Secondary | ICD-10-CM

## 2012-10-30 NOTE — Progress Notes (Signed)
[facility]  Ovidio Kin, MD,  FACS [address] [place]  [address] [place], [place]    [address] Phone:  [phone] FAX:  [phone]   Re:   [name] DOB:   04-08-1945 MRN:   [id number]  ASSESSMENT AND PLAN: 1.  Lap Band, APS - 05/12/2008  Initial weight - 276, BMI - 35.8  Goal weight was 220  Lap band sounds too tight - so I am giving her a lap band holiday.  Her return is in 3 months.  She knows to call next week and let me know how she is doing.  If she is still struggling - we'll start with a KUB.  If she is doing well, we'll keep 3 months.  2.  History of hypercholesterolemia 3.  Hypertension, off meds 4.  Fibromyalgia 5.  Lower extremity osteoarthritis 6.  History of DVT  HISTORY OF PRESENT ILLNESS: Chief Complaint  Patient presents with  . Lap Band Fill   Jean Dean is a 67 y.o. (DOB: [birth year])  white female who is a patient of HALE,WAYNE ANDREW, MD and comes to me today for follow up for lap band.  She went to [place] earlier this year and one of the women with her with her thought she had the cough of GERD.  But this got better.  More recetly though she has had reflux a lot of the time.  She cannot eat much that is solid.  She eats yogurt.  When she eats solids, like meat, she feels like she is going to vomit.  For breakfast she feels even tighter and she takes only coffee and yogurt.  At the time of my op note, she did not have a hiatal hernia - i reviewed this with her.  Social and Family History: She retired from [employer] in Nov 2014.   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  She wants to work until 56.  PHYSICAL EXAM: BP 128/74  Pulse 76  Temp(Src) 97.1 F (36.2 C) (Oral)  Resp 14  Ht 6' 1.5" (1.867 m)  Wt 226 lb (102.513 kg)  BMI 29.41 kg/m2  Lungs:  Clear. Abdomen:  Soft.  Port in the RUQ feels normal.  She has a little tenderness at the port site.  Procedure:  I removed all  the fluid from her lap band - 3.5 cc.  So she should have no fluid in the lap band.  She tolerated water after the removal, but she said she did okay with water before.  DATA REVIEWED: None new.  Ovidio Kin, MD, FACS Office:  [phone]

## 2012-11-04 ENCOUNTER — Telehealth (INDEPENDENT_AMBULATORY_CARE_PROVIDER_SITE_OTHER): Payer: Self-pay

## 2012-11-04 NOTE — Telephone Encounter (Signed)
Patient called to inform us her gerd is gone, she is requesting a appointment for a lap band fill,  advised patient she needs to give her band a break but I will inform DR. Ezzard Standing of her concerns

## 2012-11-05 ENCOUNTER — Telehealth (INDEPENDENT_AMBULATORY_CARE_PROVIDER_SITE_OTHER): Payer: Self-pay

## 2012-11-05 NOTE — Telephone Encounter (Signed)
Patient is aware she is needing to give her lap band a break per Dr. Ezzard Standing, scheduled fill for 12/12/12 @ 4:30

## 2012-11-15 ENCOUNTER — Other Ambulatory Visit: Payer: Self-pay | Admitting: Family Medicine

## 2012-12-12 ENCOUNTER — Encounter (INDEPENDENT_AMBULATORY_CARE_PROVIDER_SITE_OTHER): Payer: Self-pay | Admitting: Surgery

## 2012-12-12 ENCOUNTER — Ambulatory Visit (INDEPENDENT_AMBULATORY_CARE_PROVIDER_SITE_OTHER): Payer: BC Managed Care – PPO | Admitting: Surgery

## 2012-12-12 VITALS — BP 102/60 | HR 56 | Resp 16 | Ht 73.5 in | Wt 231.0 lb

## 2012-12-12 DIAGNOSIS — Z4651 Encounter for fitting and adjustment of gastric lap band: Secondary | ICD-10-CM

## 2012-12-12 DIAGNOSIS — Z9884 Bariatric surgery status: Secondary | ICD-10-CM

## 2012-12-12 NOTE — Progress Notes (Signed)
[facility]  Ovidio Kin, MD,  FACS [address] [place]  [address] [place], [place]    [address] Phone:  [phone] FAX:  [phone]   Re:   Jean Dean DOB:   08/29/1945 MRN:   [id number]  ASSESSMENT AND PLAN: 1.  Lap Band, APS - 05/12/2008  Initial weight - 276, BMI - 35.8  Goal weight was 220  Removing the fluid resolved her symptoms.  In fact, she has gained 5 pounds.  So she is ready to start refilled her lap band.  We know that 3.5 cc is too tight.  So I started her at 2.6 cc.  She'll see me back in 10 weeks.  2.  History of hypercholesterolemia 3.  Hypertension, off meds 4.  Fibromyalgia 5.  Lower extremity osteoarthritis 6.  History of DVT  HISTORY OF PRESENT ILLNESS: Chief Complaint  Patient presents with  . Lap Band Fill   Jean Dean is a 67 y.o. (DOB: 07/30/1945)  white female who is a patient of Donnella Sham, J, MD (she did see Dr. Ileene Musa until his retirement) and comes to me today for follow up for lap band.  She is doing much better.  Most of her reflux symptoms have resolved.  She's gained about 5 pounds and does not like that.  She is ready to get a fill.  She voted today.  She saw Dr. Delrae Alfred.  She also got her flu shot today.  Social and Family History: She retired from [employer] in Nov 2014.   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  PHYSICAL EXAM: BP 102/60  Pulse 56  Resp 16  Ht 6' 1.5" (1.867 m)  Wt 231 lb (104.781 kg)  BMI 30.06 kg/m2  Lungs:  Clear. Abdomen:  Soft.  Port in the RUQ feels normal.  She does not like the stick to access the lap band.  Procedure:  She had no fluid in her lap band.  I added 2.6 cc.  This is about where the pressure of the lap band stopped.  She tolerated water.  DATA REVIEWED: None new.  Ovidio Kin, MD, FACS Office:  [phone]

## 2012-12-26 ENCOUNTER — Other Ambulatory Visit: Payer: Self-pay

## 2013-02-23 ENCOUNTER — Other Ambulatory Visit: Payer: Self-pay | Admitting: Family Medicine

## 2013-03-12 ENCOUNTER — Encounter: Payer: Self-pay | Admitting: Family Medicine

## 2013-03-24 ENCOUNTER — Ambulatory Visit (INDEPENDENT_AMBULATORY_CARE_PROVIDER_SITE_OTHER): Payer: BC Managed Care – PPO | Admitting: Family Medicine

## 2013-03-24 ENCOUNTER — Encounter: Payer: Self-pay | Admitting: Family Medicine

## 2013-03-24 VITALS — BP 116/80 | HR 80 | Ht 73.5 in | Wt 237.4 lb

## 2013-03-24 DIAGNOSIS — E785 Hyperlipidemia, unspecified: Secondary | ICD-10-CM

## 2013-03-24 DIAGNOSIS — M76899 Other specified enthesopathies of unspecified lower limb, excluding foot: Secondary | ICD-10-CM

## 2013-03-24 DIAGNOSIS — M25551 Pain in right hip: Secondary | ICD-10-CM

## 2013-03-24 DIAGNOSIS — M7061 Trochanteric bursitis, right hip: Secondary | ICD-10-CM | POA: Insufficient documentation

## 2013-03-24 DIAGNOSIS — M549 Dorsalgia, unspecified: Secondary | ICD-10-CM | POA: Insufficient documentation

## 2013-03-24 DIAGNOSIS — M25559 Pain in unspecified hip: Secondary | ICD-10-CM

## 2013-03-24 DIAGNOSIS — E039 Hypothyroidism, unspecified: Secondary | ICD-10-CM

## 2013-03-24 DIAGNOSIS — I10 Essential (primary) hypertension: Secondary | ICD-10-CM

## 2013-03-24 LAB — CBC WITH DIFFERENTIAL/PLATELET
BASOS ABS: 0 10*3/uL (ref 0.0–0.1)
BASOS PCT: 0 % (ref 0–1)
Eosinophils Absolute: 0.2 10*3/uL (ref 0.0–0.7)
Eosinophils Relative: 3 % (ref 0–5)
HEMATOCRIT: 38.8 % (ref 36.0–46.0)
Hemoglobin: 13 g/dL (ref 12.0–15.0)
LYMPHS PCT: 32 % (ref 12–46)
Lymphs Abs: 1.8 10*3/uL (ref 0.7–4.0)
MCH: 28 pg (ref 26.0–34.0)
MCHC: 33.5 g/dL (ref 30.0–36.0)
MCV: 83.6 fL (ref 78.0–100.0)
MONO ABS: 0.4 10*3/uL (ref 0.1–1.0)
Monocytes Relative: 7 % (ref 3–12)
NEUTROS ABS: 3.1 10*3/uL (ref 1.7–7.7)
NEUTROS PCT: 58 % (ref 43–77)
PLATELETS: 178 10*3/uL (ref 150–400)
RBC: 4.64 MIL/uL (ref 3.87–5.11)
RDW: 15.9 % — AB (ref 11.5–15.5)
WBC: 5.4 10*3/uL (ref 4.0–10.5)

## 2013-03-24 LAB — LIPID PANEL
CHOL/HDL RATIO: 4.1 ratio
CHOLESTEROL: 210 mg/dL — AB (ref 0–200)
HDL: 51 mg/dL (ref >39)
LDL CALC: 123 mg/dL — AB (ref 0–99)
Triglycerides: 179 mg/dL — ABNORMAL HIGH (ref <150)
VLDL: 36 mg/dL (ref 0–40)

## 2013-03-24 LAB — COMPLETE METABOLIC PANEL WITH GFR
ALBUMIN: 4.3 g/dL (ref 3.5–5.2)
ALK PHOS: 71 U/L (ref 39–117)
ALT: 17 U/L (ref 0–35)
AST: 17 U/L (ref 0–37)
BUN: 15 mg/dL (ref 6–23)
CALCIUM: 9.7 mg/dL (ref 8.4–10.5)
CHLORIDE: 104 meq/L (ref 96–112)
CO2: 29 mEq/L (ref 19–32)
Creat: 0.77 mg/dL (ref 0.50–1.10)
GFR, Est African American: >89 mL/min
GFR, Est Non African American: 79 mL/min
Glucose, Bld: 105 mg/dL — ABNORMAL HIGH (ref 70–99)
POTASSIUM: 4.5 meq/L (ref 3.5–5.3)
SODIUM: 141 meq/L (ref 135–145)
Total Bilirubin: 0.6 mg/dL (ref 0.2–1.2)
Total Protein: 6.3 g/dL (ref 6.0–8.3)

## 2013-03-24 LAB — TSH: TSH: 3.982 u[IU]/mL (ref 0.350–4.500)

## 2013-03-24 LAB — POCT GLYCOSYLATED HEMOGLOBIN (HGB A1C): HEMOGLOBIN A1C: 5.7

## 2013-03-24 MED ORDER — METHYLPREDNISOLONE ACETATE 40 MG/ML IJ SUSP
40.0000 mg | Freq: Once | INTRAMUSCULAR | Status: AC
  Administered 2013-03-24: 40 mg via INTRA_ARTICULAR

## 2013-03-24 NOTE — Assessment & Plan Note (Signed)
Patient presents with symptoms consistent with greater trochanteric bursitis of the right hip and likely tendinosis of the right gluteal muscles. -Steroid injection of the right greater trochanteric bursa performed today -Patient will likely benefit from physical therapy for which a referral has been made

## 2013-03-24 NOTE — Assessment & Plan Note (Signed)
Patient currently asymptomatic. Due for recheck of TSH.

## 2013-03-24 NOTE — Progress Notes (Signed)
   Subjective:    Patient ID: Jean Dean, female    DOB: 10/31/1945, 68 y.o.   MRN: [id number]  HPI 68 year old female presents for evaluation of right hip and right back pain.  Patient reports a long-standing history of low back pain, she has had previous imaging including MRI of the spine performed in the late 1980s which identified an L4-L5 bulging disc per patient account, the patient also reports a history of fibromyalgia which is currently treated with Paxil, the pain is worse after long periods of sitting, she has not attempted heat or ice to the area, she is interested in referral to physical therapy for treatment with the McKenzie Method, she saw a chiropractor for this back pain approximately one year ago and had mild relief of her symptoms, no associated numbness or weakness of the lower extremities, no associated bladder or bowel incontinence, no saddle.  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  Respiratory: Negative for cough, choking and shortness of breath.   Cardiovascular: Negative for chest pain and leg swelling.  Gastrointestinal: Negative for nausea, vomiting, abdominal pain, diarrhea and abdominal distention.  Musculoskeletal: Positive for arthralgias, back pain and myalgias. Negative for joint swelling.       Objective:   Physical Exam Vitals: Reviewed General: Pleasant Caucasian female, no acute  distress HEENT: Normocephalic, pupils were equal in size bilaterally, no scleral icterus, moist mucous members Cardiac: Regular in rhythm, S1 and S2 present, no murmurs, no heaves or thrills Respiratory: Clear to auscultation bilaterally, normal effort Neuro: Strength of hip flexion and extension bilaterally was 5 out of 5, strength to knee flexion and extension bilaterally was 5 of 5, plantar and dorsiflexion of the bilateral feet was 5 out of 5, no gross sensation deficits to light touch and bilateral lower extremities, patient was tender to palpation over the right superior lumbar paraspinal muscles, no midline back tenderness was appreciated, modified slump test was negative bilaterally for numbness, patient had point tenderness over the right greater trochanter and right gluteal muscles  Procedure: The risks and benefits of greater trochanteric bursitis injection were discussed with the patient, written consent was obtained, patient was placed in the left lateral position, the area of greatest tenderness was palpated over the right lateral hip in the area of the greater trochanter, the area was prepped and draped in a sterile fashion, using a 10 cc syringe with a 1-1/2 inch 25-gauge needle 5 cc of 1% lidocaine without epinephrine and 40 mg of dexamethasone were injected into the area of the greater trochanteric bursa, the patient tolerated the procedure well, no complications were encountered, no bleeding encountered     Assessment & Plan:  Please see problem specific assessment and plan.

## 2013-03-24 NOTE — Assessment & Plan Note (Signed)
Patient due for recheck of lipid profile. Lab work completed today.

## 2013-03-24 NOTE — Assessment & Plan Note (Signed)
Patient presents with lumbar paraspinal muscle spasm, no evidence of acute neurologic deficit. -Will pursue conservative management with heat/ice. -Referral to physical therapy given as patient is interested in treatment with the McKenzie method.

## 2013-03-26 ENCOUNTER — Telehealth: Payer: Self-pay | Admitting: Family Medicine

## 2013-03-26 NOTE — Telephone Encounter (Signed)
Discussed lab results with patient, no changes to medications.

## 2013-07-13 ENCOUNTER — Other Ambulatory Visit: Payer: Self-pay | Admitting: Family Medicine

## 2013-09-08 ENCOUNTER — Encounter: Payer: Self-pay | Admitting: Family Medicine

## 2013-09-30 ENCOUNTER — Ambulatory Visit (INDEPENDENT_AMBULATORY_CARE_PROVIDER_SITE_OTHER): Payer: BC Managed Care – PPO | Admitting: Family Medicine

## 2013-09-30 ENCOUNTER — Encounter: Payer: Self-pay | Admitting: Family Medicine

## 2013-09-30 VITALS — BP 117/76 | HR 92 | Temp 97.3°F | Ht 73.0 in | Wt 246.0 lb

## 2013-09-30 DIAGNOSIS — I872 Venous insufficiency (chronic) (peripheral): Secondary | ICD-10-CM | POA: Diagnosis not present

## 2013-09-30 DIAGNOSIS — M170 Bilateral primary osteoarthritis of knee: Secondary | ICD-10-CM

## 2013-09-30 DIAGNOSIS — M1710 Unilateral primary osteoarthritis, unspecified knee: Secondary | ICD-10-CM | POA: Diagnosis not present

## 2013-09-30 NOTE — Patient Instructions (Signed)
Please come and pick up your office visit note in the next few day. Dr. Ree Kida will leave it in the front desk.

## 2013-09-30 NOTE — Assessment & Plan Note (Signed)
Completed handicap placard in office. Patient to take to [facility].

## 2013-09-30 NOTE — Progress Notes (Signed)
   Subjective:    Patient ID: Jean Dean, female    DOB: November 28, 1945, 68 y.o.   MRN: [id number]  HPI 68 y/o female presents for evaluation of venous insufficiency and varicose veins. She is currently followed at [facility] by Dr. Linus Mako. He is planning to perform endovenous laser ablation of the great saphenous vein and endovenous chemical ablation of any residual symptomatic type IV varicosities.  She has had ongoing swelling and pain in her left lower extremitiy since 2001, at that time she developed a DVT in her left leg and subsequently was on Warfarin for 6 months, she was under the care of Dr. Candelaria Celeste ([place]) during this time, she has also undergone right total knee replacement in 2007 which is likely contributing to her symptoms. She has been compliant with compression stockings and daily lasix which have provided only minimal relief of her symptoms.     Review of Systems  Constitutional: Negative for fever, chills and fatigue.  Respiratory: Negative for cough, chest tightness and shortness of breath.   Cardiovascular: Positive for chest pain and leg swelling.       Objective:   Physical Exam Vitals: reviewed Gen: pleasant female, NAD Cardiac: RRR, S1 and S2 present, no murmurs, no heaves/thrills Resp: CTAB, normal effort Ext: 1+ pitting edema of left lower extremity, trace edema of right lower extremity, varicosities of bilateral lower legs (left>right), 2+ DP pulses bilaterally, capillary refill in bilateral toes was 1-2 seconds     Assessment & Plan:  Please see problem specific assessment and plan.

## 2013-09-30 NOTE — Assessment & Plan Note (Signed)
Chronic venous insufficiency with associated varicose veins (left greater than right). Patient has failed conservative measures with compression stockings and diuretics. Patient following with Dr. Linus Mako (vascular surgery) who is planning vein laser/chemical ablation of saphenous/varicose veins. It is my medical opinion that this is the next logical step in the treatment of her symptoms.

## 2013-10-10 ENCOUNTER — Other Ambulatory Visit: Payer: Self-pay | Admitting: Gastroenterology

## 2013-10-30 ENCOUNTER — Encounter: Payer: Self-pay | Admitting: Family Medicine

## 2013-10-30 DIAGNOSIS — D126 Benign neoplasm of colon, unspecified: Secondary | ICD-10-CM | POA: Insufficient documentation

## 2013-12-29 ENCOUNTER — Other Ambulatory Visit: Payer: Self-pay | Admitting: Family Medicine

## 2014-01-10 ENCOUNTER — Other Ambulatory Visit: Payer: Self-pay | Admitting: Family Medicine

## 2014-03-31 ENCOUNTER — Encounter: Payer: Self-pay | Admitting: Family Medicine

## 2014-04-03 ENCOUNTER — Other Ambulatory Visit (HOSPITAL_COMMUNITY): Payer: Self-pay | Admitting: Orthopaedic Surgery

## 2014-04-16 ENCOUNTER — Encounter (HOSPITAL_COMMUNITY): Payer: Self-pay

## 2014-04-16 ENCOUNTER — Encounter (HOSPITAL_COMMUNITY)
Admission: RE | Admit: 2014-04-16 | Discharge: 2014-04-16 | Disposition: A | Discharge status: Home / Self Care | Payer: BC Managed Care – PPO | Source: Ambulatory Visit | Attending: Orthopaedic Surgery | Admitting: Orthopaedic Surgery

## 2014-04-16 DIAGNOSIS — Z01818 Encounter for other preprocedural examination: Secondary | ICD-10-CM | POA: Diagnosis not present

## 2014-04-16 DIAGNOSIS — M1611 Unilateral primary osteoarthritis, right hip: Secondary | ICD-10-CM | POA: Diagnosis not present

## 2014-04-16 HISTORY — DX: Fibromyalgia: M79.7

## 2014-04-16 HISTORY — DX: Hypothyroidism, unspecified: E03.9

## 2014-04-16 HISTORY — DX: Unspecified osteoarthritis, unspecified site: M19.90

## 2014-04-16 HISTORY — DX: Acute embolism and thrombosis of unspecified deep veins of unspecified lower extremity: I82.409

## 2014-04-16 LAB — PROTIME-INR
INR: 1.04 (ref 0.00–1.49)
PROTHROMBIN TIME: 13.7 s (ref 11.6–15.2)

## 2014-04-16 LAB — CBC
HEMATOCRIT: 38.7 % (ref 36.0–46.0)
Hemoglobin: 12.3 g/dL (ref 12.0–15.0)
MCH: 28.3 pg (ref 26.0–34.0)
MCHC: 31.8 g/dL (ref 30.0–36.0)
MCV: 89.2 fL (ref 78.0–100.0)
Platelets: 180 10*3/uL (ref 150–400)
RBC: 4.34 MIL/uL (ref 3.87–5.11)
RDW: 14.7 % (ref 11.5–15.5)
WBC: 5.3 10*3/uL (ref 4.0–10.5)

## 2014-04-16 LAB — APTT: aPTT: 31 seconds (ref 24–37)

## 2014-04-16 LAB — COMPREHENSIVE METABOLIC PANEL
ALT: 15 U/L (ref 0–35)
AST: 18 U/L (ref 0–37)
Albumin: 4.3 g/dL (ref 3.5–5.2)
Alkaline Phosphatase: 82 U/L (ref 39–117)
Anion gap: 9 (ref 5–15)
BUN: 17 mg/dL (ref 6–23)
CHLORIDE: 105 mmol/L (ref 96–112)
CO2: 28 mmol/L (ref 19–32)
Calcium: 9.6 mg/dL (ref 8.4–10.5)
Creatinine, Ser: 0.71 mg/dL (ref 0.50–1.10)
GFR calc non Af Amer: 85 mL/min — ABNORMAL LOW (ref >90)
GLUCOSE: 72 mg/dL (ref 70–99)
Potassium: 3.8 mmol/L (ref 3.5–5.1)
Sodium: 142 mmol/L (ref 135–145)
Total Bilirubin: 0.6 mg/dL (ref 0.3–1.2)
Total Protein: 7.2 g/dL (ref 6.0–8.3)

## 2014-04-16 LAB — SURGICAL PCR SCREEN
MRSA, PCR: NEGATIVE
Staphylococcus aureus: NEGATIVE

## 2014-04-16 LAB — ABO/RH: ABO/RH(D): A POS

## 2014-04-16 NOTE — Patient Instructions (Addendum)
[place]  04/16/2014   Your procedure is scheduled on:   04-24-2014  Enter through [facility]  Entrance and follow signs to [facility] Arrive at        0530 AM.  Call this number if you have problems the morning of surgery: [phone]  Or Presurgical Testing [phone].   For Living Will and/or Health Care Power Attorney Forms: please provide copy for your medical record,may bring AM of surgery(Forms should be already notarized -we do not provide this service).(04-16-14  No information preferred today).     Do not eat food/ or drink: After Midnight.     Take these medicines the morning of surgery with A SIP OF WATER: Levothyroxine. Lipitor. Paxil.   Do not wear jewelry, make-up or nail polish.  Do not wear deodorant, lotions, powders, or perfumes.   Do not shave legs and under arms- 48 hours(2 days) prior to first CHG shower.(Shaving face and neck okay.)  Do not bring valuables to the hospital.(Hospital is not responsible for lost valuables).  Contacts, dentures or removable bridgework, body piercing, hair pins may not be worn into surgery.  Leave suitcase in the car. After surgery it may be brought to your room.  For patients admitted to the hospital, checkout time is 11:00 AM the day of discharge.(Restricted visitors-Any Persons displaying flu-like symptoms or illness).    Patients discharged the day of surgery will not be allowed to drive home. Must have responsible person with you x 24 hours once discharged.  Name and phone number of your driver: Colette Ribas- [phone] cell     Please read over the following fact sheets that you were given:  CHG(Chlorhexidine Gluconate 4% Surgical Soap) use, MRSA Information.  Remember : Type/Screen "Blue armbands" - may not be removed once applied(would result in being retested AM of surgery, if removed).         [facility] - Preparing for Surgery Before surgery, you can play an important role.  Because skin  is not sterile, your skin needs to be as free of germs as possible.  You can reduce the number of germs on your skin by washing with CHG (chlorahexidine gluconate) soap before surgery.  CHG is an antiseptic cleaner which kills germs and bonds with the skin to continue killing germs even after washing. Please DO NOT use if you have an allergy to CHG or antibacterial soaps.  If your skin becomes reddened/irritated stop using the CHG and inform your nurse when you arrive at Short Stay. Do not shave (including legs and underarms) for at least 48 hours prior to the first CHG shower.  You may shave your face/neck. Please follow these instructions carefully:  1.  Shower with CHG Soap the night before surgery and the  morning of Surgery.  2.  If you choose to wash your hair, wash your hair first as usual with your  normal  shampoo.  3.  After you shampoo, rinse your hair and body thoroughly to remove the  shampoo.                           4.  Use CHG as you would any other liquid soap.  You can apply chg directly  to the skin and wash                       Gently with a scrungie or clean washcloth.  5.  Apply the CHG Soap to your body ONLY FROM THE NECK DOWN.   Do not use on face/ open                           Wound or open sores. Avoid contact with eyes, ears mouth and genitals (private parts).                       Wash face,  Genitals (private parts) with your normal soap.             6.  Wash thoroughly, paying special attention to the area where your surgery  will be performed.  7.  Thoroughly rinse your body with warm water from the neck down.  8.  DO NOT shower/wash with your normal soap after using and rinsing off  the CHG Soap.                9.  Pat yourself dry with a clean towel.            10.  Wear clean pajamas.            11.  Place clean sheets on your bed the night of your first shower and do not  sleep with pets. Day of Surgery : Do not apply any lotions/deodorants the morning of  surgery.  Please wear clean clothes to the hospital/surgery center.  FAILURE TO FOLLOW THESE INSTRUCTIONS MAY RESULT IN THE CANCELLATION OF YOUR SURGERY PATIENT SIGNATURE_________________________________  NURSE SIGNATURE__________________________________  ________________________________________________________________________

## 2014-04-24 ENCOUNTER — Inpatient Hospital Stay (HOSPITAL_COMMUNITY): Payer: BC Managed Care – PPO

## 2014-04-24 ENCOUNTER — Encounter (HOSPITAL_COMMUNITY): Payer: Self-pay | Admitting: Anesthesiology

## 2014-04-24 ENCOUNTER — Inpatient Hospital Stay (HOSPITAL_COMMUNITY): Payer: BC Managed Care – PPO | Admitting: Anesthesiology

## 2014-04-24 ENCOUNTER — Inpatient Hospital Stay (HOSPITAL_COMMUNITY)
Admission: RE | Admit: 2014-04-24 | Discharge: 2014-04-25 | DRG: 470 | Disposition: A | Discharge status: Home Health | Payer: BC Managed Care – PPO | Source: Ambulatory Visit | Attending: Orthopaedic Surgery | Admitting: Orthopaedic Surgery

## 2014-04-24 ENCOUNTER — Encounter (HOSPITAL_COMMUNITY)
Admission: RE | Disposition: A | Discharge status: Home Health | Payer: Self-pay | Source: Ambulatory Visit | Attending: Orthopaedic Surgery

## 2014-04-24 DIAGNOSIS — H9192 Unspecified hearing loss, left ear: Secondary | ICD-10-CM | POA: Diagnosis present

## 2014-04-24 DIAGNOSIS — Z79899 Other long term (current) drug therapy: Secondary | ICD-10-CM

## 2014-04-24 DIAGNOSIS — Z01812 Encounter for preprocedural laboratory examination: Secondary | ICD-10-CM

## 2014-04-24 DIAGNOSIS — Z87891 Personal history of nicotine dependence: Secondary | ICD-10-CM | POA: Diagnosis not present

## 2014-04-24 DIAGNOSIS — E039 Hypothyroidism, unspecified: Secondary | ICD-10-CM | POA: Diagnosis present

## 2014-04-24 DIAGNOSIS — Z6832 Body mass index (BMI) 32.0-32.9, adult: Secondary | ICD-10-CM | POA: Diagnosis not present

## 2014-04-24 DIAGNOSIS — I10 Essential (primary) hypertension: Secondary | ICD-10-CM | POA: Diagnosis present

## 2014-04-24 DIAGNOSIS — M1611 Unilateral primary osteoarthritis, right hip: Principal | ICD-10-CM

## 2014-04-24 DIAGNOSIS — E669 Obesity, unspecified: Secondary | ICD-10-CM | POA: Diagnosis present

## 2014-04-24 DIAGNOSIS — G4730 Sleep apnea, unspecified: Secondary | ICD-10-CM | POA: Diagnosis present

## 2014-04-24 DIAGNOSIS — M25559 Pain in unspecified hip: Secondary | ICD-10-CM

## 2014-04-24 DIAGNOSIS — M797 Fibromyalgia: Secondary | ICD-10-CM | POA: Diagnosis present

## 2014-04-24 DIAGNOSIS — Z7982 Long term (current) use of aspirin: Secondary | ICD-10-CM | POA: Diagnosis not present

## 2014-04-24 DIAGNOSIS — Z96641 Presence of right artificial hip joint: Secondary | ICD-10-CM

## 2014-04-24 DIAGNOSIS — D62 Acute posthemorrhagic anemia: Secondary | ICD-10-CM | POA: Diagnosis not present

## 2014-04-24 DIAGNOSIS — E785 Hyperlipidemia, unspecified: Secondary | ICD-10-CM | POA: Diagnosis present

## 2014-04-24 DIAGNOSIS — Z96659 Presence of unspecified artificial knee joint: Secondary | ICD-10-CM | POA: Diagnosis present

## 2014-04-24 DIAGNOSIS — M25551 Pain in right hip: Secondary | ICD-10-CM | POA: Diagnosis present

## 2014-04-24 HISTORY — PX: TOTAL HIP ARTHROPLASTY: SHX124

## 2014-04-24 LAB — TYPE AND SCREEN
ABO/RH(D): A POS
Antibody Screen: NEGATIVE

## 2014-04-24 SURGERY — ARTHROPLASTY, HIP, TOTAL, ANTERIOR APPROACH
Anesthesia: Spinal | Site: Hip | Laterality: Right

## 2014-04-24 MED ORDER — ZOLPIDEM TARTRATE 5 MG PO TABS: 5.0000 mg | ORAL_TABLET | Freq: Every evening | ORAL | Status: DC | PRN

## 2014-04-24 MED ORDER — DOCUSATE SODIUM 100 MG PO CAPS
100.0000 mg | ORAL_CAPSULE | Freq: Two times a day (BID) | ORAL | Status: DC
  Administered 2014-04-24 – 2014-04-25 (×3): 100 mg via ORAL

## 2014-04-24 MED ORDER — OXYCODONE HCL 5 MG PO TABS
5.0000 mg | ORAL_TABLET | ORAL | Status: DC | PRN
  Administered 2014-04-24: 10 mg via ORAL
  Administered 2014-04-24 (×2): 5 mg via ORAL
  Administered 2014-04-24: 10 mg via ORAL
  Filled 2014-04-24: qty 2
  Filled 2014-04-24: qty 1
  Filled 2014-04-24: qty 2
  Filled 2014-04-24: qty 1

## 2014-04-24 MED ORDER — ALUM & MAG HYDROXIDE-SIMETH 200-200-20 MG/5ML PO SUSP: 30.0000 mL | ORAL | Status: DC | PRN

## 2014-04-24 MED ORDER — STERILE WATER FOR IRRIGATION IR SOLN
Status: DC | PRN
Start: 2014-04-24 — End: 2014-04-24
  Administered 2014-04-24: 1500 mL

## 2014-04-24 MED ORDER — EPHEDRINE SULFATE 50 MG/ML IJ SOLN
INTRAMUSCULAR | Status: DC | PRN
  Administered 2014-04-24 (×3): 5 mg via INTRAVENOUS

## 2014-04-24 MED ORDER — PROPOFOL INFUSION 10 MG/ML OPTIME
INTRAVENOUS | Status: DC | PRN
  Administered 2014-04-24: 75 ug/kg/min via INTRAVENOUS

## 2014-04-24 MED ORDER — PROPOFOL 10 MG/ML IV BOLUS
INTRAVENOUS | Status: AC
  Filled 2014-04-24: qty 20

## 2014-04-24 MED ORDER — CEFAZOLIN SODIUM 1-5 GM-% IV SOLN
1.0000 g | Freq: Four times a day (QID) | INTRAVENOUS | Status: AC
  Administered 2014-04-24 (×2): 1 g via INTRAVENOUS
  Filled 2014-04-24 (×2): qty 50

## 2014-04-24 MED ORDER — BUPIVACAINE HCL (PF) 0.5 % IJ SOLN
INTRAMUSCULAR | Status: AC
  Filled 2014-04-24: qty 30

## 2014-04-24 MED ORDER — CEFAZOLIN SODIUM-DEXTROSE 2-3 GM-% IV SOLR
INTRAVENOUS | Status: AC
  Filled 2014-04-24: qty 50

## 2014-04-24 MED ORDER — PHENOL 1.4 % MT LIQD
1.0000 | OROMUCOSAL | Status: DC | PRN
  Filled 2014-04-24: qty 177

## 2014-04-24 MED ORDER — LACTATED RINGERS IV SOLN
INTRAVENOUS | Status: DC | PRN
  Administered 2014-04-24: 07:00:00 via INTRAVENOUS

## 2014-04-24 MED ORDER — SODIUM CHLORIDE 0.9 % IR SOLN
Status: DC | PRN
  Administered 2014-04-24 (×2): 1000 mL

## 2014-04-24 MED ORDER — ONDANSETRON HCL 4 MG/2ML IJ SOLN
4.0000 mg | Freq: Four times a day (QID) | INTRAMUSCULAR | Status: DC | PRN
Start: 2014-04-24 — End: 2014-04-25

## 2014-04-24 MED ORDER — FENTANYL CITRATE 0.05 MG/ML IJ SOLN
INTRAMUSCULAR | Status: DC | PRN
  Administered 2014-04-24: 50 ug via INTRAVENOUS

## 2014-04-24 MED ORDER — PROMETHAZINE HCL 25 MG/ML IJ SOLN: 6.2500 mg | INTRAMUSCULAR | Status: DC | PRN

## 2014-04-24 MED ORDER — ASPIRIN EC 325 MG PO TBEC
325.0000 mg | DELAYED_RELEASE_TABLET | Freq: Two times a day (BID) | ORAL | Status: DC
  Administered 2014-04-24 – 2014-04-25 (×2): 325 mg via ORAL
  Filled 2014-04-24 (×4): qty 1

## 2014-04-24 MED ORDER — HYDROMORPHONE HCL 1 MG/ML IJ SOLN
1.0000 mg | INTRAMUSCULAR | Status: DC | PRN
Start: 2014-04-24 — End: 2014-04-25
  Administered 2014-04-24: 1 mg via INTRAVENOUS
  Filled 2014-04-24: qty 1

## 2014-04-24 MED ORDER — LEVOTHYROXINE SODIUM 150 MCG PO TABS
150.0000 ug | ORAL_TABLET | Freq: Every day | ORAL | Status: DC
  Administered 2014-04-25: 150 ug via ORAL
  Filled 2014-04-24 (×2): qty 1

## 2014-04-24 MED ORDER — FENTANYL CITRATE 0.05 MG/ML IJ SOLN
INTRAMUSCULAR | Status: AC
  Filled 2014-04-24: qty 2

## 2014-04-24 MED ORDER — SODIUM CHLORIDE 0.9 % IJ SOLN
INTRAMUSCULAR | Status: AC
  Filled 2014-04-24: qty 10

## 2014-04-24 MED ORDER — VITAMIN D3 25 MCG (1000 UNIT) PO TABS
1000.0000 [IU] | ORAL_TABLET | Freq: Every morning | ORAL | Status: DC
  Administered 2014-04-24 – 2014-04-25 (×2): 1000 [IU] via ORAL
  Filled 2014-04-24 (×2): qty 1

## 2014-04-24 MED ORDER — BUPIVACAINE HCL (PF) 0.5 % IJ SOLN
INTRAMUSCULAR | Status: DC | PRN
  Administered 2014-04-24: 3 mL

## 2014-04-24 MED ORDER — DIPHENHYDRAMINE HCL 12.5 MG/5ML PO ELIX: 12.5000 mg | ORAL_SOLUTION | ORAL | Status: DC | PRN

## 2014-04-24 MED ORDER — ACETAMINOPHEN 650 MG RE SUPP: 650.0000 mg | Freq: Four times a day (QID) | RECTAL | Status: DC | PRN

## 2014-04-24 MED ORDER — HYDROMORPHONE HCL 1 MG/ML IJ SOLN: 0.2500 mg | INTRAMUSCULAR | Status: DC | PRN

## 2014-04-24 MED ORDER — METHOCARBAMOL 1000 MG/10ML IJ SOLN
500.0000 mg | Freq: Four times a day (QID) | INTRAVENOUS | Status: DC | PRN
  Administered 2014-04-24: 500 mg via INTRAVENOUS
  Filled 2014-04-24 (×2): qty 5

## 2014-04-24 MED ORDER — ONDANSETRON HCL 4 MG/2ML IJ SOLN
INTRAMUSCULAR | Status: DC | PRN
  Administered 2014-04-24: 4 mg via INTRAVENOUS

## 2014-04-24 MED ORDER — CEFAZOLIN SODIUM-DEXTROSE 2-3 GM-% IV SOLR
2.0000 g | INTRAVENOUS | Status: AC
  Administered 2014-04-24: 2 g via INTRAVENOUS

## 2014-04-24 MED ORDER — LIDOCAINE HCL (CARDIAC) 20 MG/ML IV SOLN
INTRAVENOUS | Status: DC | PRN
  Administered 2014-04-24: 100 mg via INTRAVENOUS

## 2014-04-24 MED ORDER — LIDOCAINE HCL (CARDIAC) 20 MG/ML IV SOLN
INTRAVENOUS | Status: AC
  Filled 2014-04-24: qty 5

## 2014-04-24 MED ORDER — ONDANSETRON HCL 4 MG/2ML IJ SOLN
INTRAMUSCULAR | Status: AC
  Filled 2014-04-24: qty 2

## 2014-04-24 MED ORDER — ACETAMINOPHEN 325 MG PO TABS
650.0000 mg | ORAL_TABLET | Freq: Four times a day (QID) | ORAL | Status: DC | PRN
  Administered 2014-04-24 – 2014-04-25 (×2): 650 mg via ORAL
  Filled 2014-04-24 (×2): qty 2

## 2014-04-24 MED ORDER — METOCLOPRAMIDE HCL 10 MG PO TABS: 5.0000 mg | ORAL_TABLET | Freq: Three times a day (TID) | ORAL | Status: DC | PRN

## 2014-04-24 MED ORDER — MIDAZOLAM HCL 2 MG/2ML IJ SOLN
INTRAMUSCULAR | Status: AC
  Filled 2014-04-24: qty 2

## 2014-04-24 MED ORDER — LACTATED RINGERS IV SOLN: INTRAVENOUS | Status: DC

## 2014-04-24 MED ORDER — ONDANSETRON HCL 4 MG PO TABS: 4.0000 mg | ORAL_TABLET | Freq: Four times a day (QID) | ORAL | Status: DC | PRN

## 2014-04-24 MED ORDER — ATROPINE SULFATE 0.4 MG/ML IJ SOLN
INTRAMUSCULAR | Status: AC
  Filled 2014-04-24: qty 2

## 2014-04-24 MED ORDER — MENTHOL 3 MG MT LOZG
1.0000 | LOZENGE | OROMUCOSAL | Status: DC | PRN
  Filled 2014-04-24: qty 9

## 2014-04-24 MED ORDER — METOCLOPRAMIDE HCL 5 MG/ML IJ SOLN: 5.0000 mg | Freq: Three times a day (TID) | INTRAMUSCULAR | Status: DC | PRN

## 2014-04-24 MED ORDER — ATORVASTATIN CALCIUM 10 MG PO TABS
10.0000 mg | ORAL_TABLET | Freq: Every morning | ORAL | Status: DC
  Administered 2014-04-25: 10 mg via ORAL
  Filled 2014-04-24: qty 1

## 2014-04-24 MED ORDER — POLYETHYLENE GLYCOL 3350 17 G PO PACK: 17.0000 g | PACK | Freq: Every day | ORAL | Status: DC | PRN

## 2014-04-24 MED ORDER — METHOCARBAMOL 500 MG PO TABS
500.0000 mg | ORAL_TABLET | Freq: Four times a day (QID) | ORAL | Status: DC | PRN
  Administered 2014-04-24 – 2014-04-25 (×2): 500 mg via ORAL
  Filled 2014-04-24 (×2): qty 1

## 2014-04-24 MED ORDER — PAROXETINE HCL 20 MG PO TABS
20.0000 mg | ORAL_TABLET | Freq: Every morning | ORAL | Status: DC
Start: 2014-04-25 — End: 2014-04-25
  Administered 2014-04-25: 20 mg via ORAL
  Filled 2014-04-24: qty 1

## 2014-04-24 MED ORDER — EPHEDRINE SULFATE 50 MG/ML IJ SOLN
INTRAMUSCULAR | Status: AC
  Filled 2014-04-24: qty 1

## 2014-04-24 MED ORDER — MIDAZOLAM HCL 5 MG/5ML IJ SOLN
INTRAMUSCULAR | Status: DC | PRN
  Administered 2014-04-24: 2 mg via INTRAVENOUS

## 2014-04-24 MED ORDER — SODIUM CHLORIDE 0.9 % IV SOLN
INTRAVENOUS | Status: DC
  Administered 2014-04-24 – 2014-04-25 (×2): via INTRAVENOUS

## 2014-04-24 SURGICAL SUPPLY — 46 items
APL SKNCLS STERI-STRIP NONHPOA (GAUZE/BANDAGES/DRESSINGS) ×1
BAG DECANTER FOR FLEXI CONT (MISCELLANEOUS) ×3 IMPLANT
BAG SPEC THK2 15X12 ZIP CLS (MISCELLANEOUS)
BAG ZIPLOCK 12X15 (MISCELLANEOUS) IMPLANT
BENZOIN TINCTURE PRP APPL 2/3 (GAUZE/BANDAGES/DRESSINGS) ×2 IMPLANT
BLADE SAW SGTL 18X1.27X75 (BLADE) ×2 IMPLANT
BLADE SAW SGTL 18X1.27X75MM (BLADE) ×1
CAPT HIP TOTAL 2 ×2 IMPLANT
CELLS DAT CNTRL 66122 CELL SVR (MISCELLANEOUS) ×1 IMPLANT
CLOSURE WOUND 1/2 X4 (GAUZE/BANDAGES/DRESSINGS) ×1
COVER PERINEAL POST (MISCELLANEOUS) ×3 IMPLANT
DRAPE C-ARM 42X120 X-RAY (DRAPES) ×3 IMPLANT
DRAPE STERI IOBAN 125X83 (DRAPES) ×3 IMPLANT
DRAPE U-SHAPE 47X51 STRL (DRAPES) ×9 IMPLANT
DRSG AQUACEL AG ADV 3.5X10 (GAUZE/BANDAGES/DRESSINGS) ×3 IMPLANT
DURAPREP 26ML APPLICATOR (WOUND CARE) ×3 IMPLANT
ELECT BLADE TIP CTD 4 INCH (ELECTRODE) ×3 IMPLANT
ELECT REM PT RETURN 9FT ADLT (ELECTROSURGICAL) ×3
ELECTRODE REM PT RTRN 9FT ADLT (ELECTROSURGICAL) ×1 IMPLANT
FACESHIELD WRAPAROUND (MASK) ×12 IMPLANT
FACESHIELD WRAPAROUND OR TEAM (MASK) ×4 IMPLANT
GAUZE XEROFORM 1X8 LF (GAUZE/BANDAGES/DRESSINGS) IMPLANT
GLOVE BIO SURGEON STRL SZ7.5 (GLOVE) ×3 IMPLANT
GLOVE BIOGEL PI IND STRL 8 (GLOVE) ×2 IMPLANT
GLOVE BIOGEL PI INDICATOR 8 (GLOVE) ×4
GLOVE ECLIPSE 8.0 STRL XLNG CF (GLOVE) ×3 IMPLANT
GOWN STRL REUS W/TWL XL LVL3 (GOWN DISPOSABLE) ×6 IMPLANT
HANDPIECE INTERPULSE COAX TIP (DISPOSABLE) ×3
KIT BASIN OR (CUSTOM PROCEDURE TRAY) ×3 IMPLANT
PACK TOTAL JOINT (CUSTOM PROCEDURE TRAY) ×3 IMPLANT
PEN SKIN MARKING BROAD (MISCELLANEOUS) ×3 IMPLANT
RETRACTOR WND ALEXIS 18 MED (MISCELLANEOUS) ×1 IMPLANT
RTRCTR WOUND ALEXIS 18CM MED (MISCELLANEOUS) ×3
SET HNDPC FAN SPRY TIP SCT (DISPOSABLE) ×1 IMPLANT
STAPLER VISISTAT 35W (STAPLE) IMPLANT
STRIP CLOSURE SKIN 1/2X4 (GAUZE/BANDAGES/DRESSINGS) ×1 IMPLANT
SUT ETHIBOND NAB CT1 #1 30IN (SUTURE) ×3 IMPLANT
SUT ETHILON 3 0 PS 1 (SUTURE) ×2 IMPLANT
SUT MNCRL AB 4-0 PS2 18 (SUTURE) IMPLANT
SUT VIC AB 0 CT1 36 (SUTURE) ×3 IMPLANT
SUT VIC AB 1 CT1 36 (SUTURE) ×3 IMPLANT
SUT VIC AB 2-0 CT1 27 (SUTURE) ×9
SUT VIC AB 2-0 CT1 TAPERPNT 27 (SUTURE) ×2 IMPLANT
TOWEL OR 17X26 10 PK STRL BLUE (TOWEL DISPOSABLE) ×3 IMPLANT
TOWEL OR NON WOVEN STRL DISP B (DISPOSABLE) ×3 IMPLANT
TRAY FOLEY CATH 14FRSI W/METER (CATHETERS) ×3 IMPLANT

## 2014-04-24 NOTE — Plan of Care (Signed)
Problem: Consults Goal: Diagnosis- Total Joint Replacement Primary Total Hip     

## 2014-04-24 NOTE — Op Note (Signed)
Jean Dean, Jean Dean NO.:  [id number]  MEDICAL RECORD NO.:  [id number]  LOCATION:  [facility]                         FACILITY:  [facility]  PHYSICIAN:  Lind Guest. Ninfa Linden, M.D.DATE OF BIRTH:  18-Oct-1945  DATE OF PROCEDURE:  04/24/2014 DATE OF DISCHARGE:                              OPERATIVE REPORT   PREOPERATIVE DIAGNOSIS:  Primary osteoarthritis and degenerative joint disease, right hip.  POSTOPERATIVE DIAGNOSIS:  Primary osteoarthritis and degenerative joint disease, right hip.  PROCEDURE:  Right total hip arthroplasty through direct anterior approach.  IMPLANTS:  DePuy Sector Gription acetabular component size 52 with apex hole eliminator and a single screw, size 36 +0 neutral polyethylene liner, size 15 Corail femoral component with the varus offset (KLA), size 36 -2 metal hip ball.  SURGEON:  Jean Rosenthal, M.D.  ASSISTANT:  Erskine Emery, PA-C  ANESTHESIA:  Spinal.  ANTIBIOTICS:  2 g IV Ancef.  BLOOD LOSS:  250 mL.  COMPLICATIONS:  None.  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 This has become quite painful to her.  Her mobility is becoming limited. Her pain is daily, it is affecting her sleep and her gait.  She has tried all forms of conservative treatment and these have failed.  This also affected her activities of daily living and her quality of life. At this point, she does wish to proceed with her anterior hip replacement surgery.  She understands fully the risks of acute blood loss anemia, nerve and vessel injury, fracture, fracture dislocation, DVT.  She understands the goals are decreased pain, improved mobility, and overall improved quality of life.  PROCEDURE DESCRIPTION:  After informed consent was obtained and appropriate right hip was marked, she was brought to the operating room where spinal anesthesia was obtained while  she was on her stretcher. Foley catheter was placed and then traction boots were placed on both of her feet.  She was next placed supine on the Hana fracture table with perineal post in place and both legs in inline skeletal traction devices, but no traction applied.  Her right operative hip was then prepped and draped with DuraPrep and sterile drapes.  A time-out was called.  She was identified as correct patient, correct right hip.  I then made an incision inferior and posterior to the anterior-superior iliac spine and carried this obliquely down the leg.  I dissected down the tensor fascia lata muscle and the tensor fascia was then divided longitudinally so I could proceed with the direct anterior approach to the hip.  We identified and cauterized the lateral femoral circumflex vessels and then placed a Cobra retractor around the lateral neck and up underneath the rectus femoris, a Cobra retractor medially.  I then opened up the hip capsule in a L-type format finding large joint effusion and finding significant periarticular osteophytes.  I placed the Cobra retractors within the joint capsule and then made my femoral neck cut with an oscillating saw proximal to the lesser trochanter and completed this with an osteotome.  I then placed a corkscrew guide in the femoral head and removed the femoral head in its entirety and found it  to be devoid of cartilage.  I then cleaned the acetabular remnants, acetabular labrum, and other debris.  I placed a bent Hohmann medially and then began reaming under direct visualization from a size 42 all the way up to a size 52 with all reamers under direct visualization, the last reamer also under direct fluoroscopy, so I could obtain my depth of reaming, my inclination and anteversion.  Once I was pleased with this, I placed the real DePuy Sector Gription acetabular component size 52 and apex hole eliminator and a single screw.  I then placed the real 36  +0 neutral polyethylene liner.  Attention was then turned to the femur. With the leg externally rotated to about 100 degrees extended and adducted, I was able to place a Mueller retractor medially and a Hohmann retractor behind the greater trochanter.  I released the lateral joint capsule.  We then began broaching using the Corail broaching system starting with a size 8 and we actually went all the way up to a size 15 and with the size 15, we trialed a standard offset femoral neck and a 36 +1.5 hip, brought the leg back over and up with traction and internal rotation, reduced in the pelvis.  I was pleased with the stability but it definitely felt tight.  She seemed a little bit long, so we ended up then dislocating the hip and removed the trial components.  I placed the real Corail femoral component size 15 but with varus offset and then went with a real 36 -2 metal hip ball, reduced it back in the acetabulum, I was much more pleased with her offset, leg lengths, range of motion, and tightness.  We then copiously irrigated the soft tissues with normal saline solution.  We were able to close the joint capsule with interrupted #1 Ethibond suture.  We placed running #1 Vicryl in the tensor fascia, 0 Vicryl in the deep tissue, 2-0 Vicryl in the subcutaneous tissue, 4-0 Monocryl subcuticular stitch and Steri-Strips on the skin.  An Aquacel dressing was applied.  She was taken off the Hana table and taken to recovery room in stable condition.  All final counts were correct and no complications noted.  Of note, Benita Stabile, PA- C assisted in the entire case and his assistance was crucial throughout all aspects of this case and certainly necessary to help facilitate the surgery.     Lind Guest. Ninfa Linden, M.D.     CYB/MEDQ  D:  04/24/2014  T:  04/24/2014  Job:  [id number]

## 2014-04-24 NOTE — Evaluation (Signed)
Physical Therapy Evaluation Patient Details Name: Jean Dean MRN: [id number] DOB: 1945/12/14 Today's Date: 04/24/2014   History of Present Illness  R THR  Clinical Impression  Pt s/p R THR presents with decreased R LE strength/ROM and post op pain limiting functional mobility.  Pt should progress well to dc home with family assist and HHPT follow up.    Follow Up Recommendations Home health PT    Equipment Recommendations  None recommended by PT    Recommendations for Other Services OT consult     Precautions / Restrictions Precautions Precautions: Fall Restrictions Weight Bearing Restrictions: No Other Position/Activity Restrictions: WBAT      Mobility  Bed Mobility Overal bed mobility: Needs Assistance Bed Mobility: Supine to Sit;Sit to Supine     Supine to sit: Min assist;Mod assist Sit to supine: Min assist;Mod assist   General bed mobility comments: cues for sequence and use of L LE to self assist  Transfers Overall transfer level: Needs assistance Equipment used: Rolling walker (2 wheeled) Transfers: Sit to/from Stand Sit to Stand: Min assist;Mod assist         General transfer comment: cues for transition position and use of UEs to self assist  Ambulation/Gait Ambulation/Gait assistance: Min assist Ambulation Distance (Feet): 150 Feet Assistive device: Rolling walker (2 wheeled) Gait Pattern/deviations: Step-to pattern;Step-through pattern;Decreased step length - right;Decreased step length - left;Shuffle;Trunk flexed Gait velocity: decr   General Gait Details: cues for posture, ER on R, position from RW and initial sequence  Stairs            Wheelchair Mobility    Modified Rankin (Stroke Patients Only)       Balance                                             Pertinent Vitals/Pain Pain Assessment: 0-10 Pain Score: 4  Pain Location: R hip and buttock Pain Descriptors / Indicators: Aching;Sore Pain  Intervention(s): Limited activity within patient's tolerance;Monitored during session;Premedicated before session;Ice applied    Home Living Family/patient expects to be discharged to:: Private residence Living Arrangements: Alone Available Help at Discharge: Family;Friend(s);Available 24 hours/day Type of Home: House Home Access: Stairs to enter Entrance Stairs-Rails: None Entrance Stairs-Number of Steps: 1+1 Home Layout: One level Home Equipment: Walker - 4 wheels;Cane - single point      Prior Function Level of Independence: Independent               Hand Dominance        Extremity/Trunk Assessment   Upper Extremity Assessment: Overall WFL for tasks assessed           Lower Extremity Assessment: RLE deficits/detail RLE Deficits / Details: 2+/5 hip with AAROM at hip to 75 flex and 20 abd    Cervical / Trunk Assessment: Normal  Communication   Communication: HOH  Cognition Arousal/Alertness: Awake/alert Behavior During Therapy: WFL for tasks assessed/performed Overall Cognitive Status: Within Functional Limits for tasks assessed                      General Comments      Exercises Total Joint Exercises Ankle Circles/Pumps: AROM;Both;15 reps;Supine Quad Sets: AROM;Both;10 reps;Supine Heel Slides: AAROM;Right;15 reps;Supine Hip ABduction/ADduction: AAROM;Right;10 reps;Supine      Assessment/Plan    PT Assessment Patient needs continued PT services  PT Diagnosis Difficulty walking  PT Problem List Decreased strength;Decreased range of motion;Decreased activity tolerance;Decreased mobility;Decreased knowledge of use of DME;Pain  PT Treatment Interventions DME instruction;Gait training;Stair training;Functional mobility training;Therapeutic activities;Therapeutic exercise;Patient/family education   PT Goals (Current goals can be found in the Care Plan section) Acute Rehab PT Goals Patient Stated Goal: Resume previous lifestyle with decreased  pain PT Goal Formulation: With patient Time For Goal Achievement: 05/01/14 Potential to Achieve Goals: Good    Frequency 7X/week   Barriers to discharge        Co-evaluation               End of Session Equipment Utilized During Treatment: Gait belt Activity Tolerance: Patient tolerated treatment well Patient left: in bed;with call bell/phone within reach;with family/visitor present;with nursing/sitter in room Nurse Communication: Mobility status         Time: 4128-7867 PT Time Calculation (min) (ACUTE ONLY): 41 min   Charges:   PT Evaluation $Initial PT Evaluation Tier I: 1 Procedure PT Treatments $Gait Training: 8-22 mins $Therapeutic Exercise: 8-22 mins   PT G Codes:        Kensi Karr 05/02/2014, 5:08 PM

## 2014-04-24 NOTE — Transfer of Care (Signed)
Immediate Anesthesia Transfer of Care Note  Patient: Jean Dean  Procedure(s) Performed: Procedure(s): RIGHT TOTAL HIP ARTHROPLASTY ANTERIOR APPROACH (Right)  Patient Location: PACU  Anesthesia Type:MAC and Spinal  Level of Consciousness: awake, alert , oriented and patient cooperative  Airway & Oxygen Therapy: Patient Spontanous Breathing and Patient connected to face mask oxygen  Post-op Assessment: Report given to RN and Post -op Vital signs reviewed and stable  Post vital signs: Reviewed and stable  Last Vitals:  Filed Vitals:   04/24/14 0542  BP: 130/88  Pulse: 90  Temp: 36.4 C  Resp: 18    Complications: No apparent anesthesia complications

## 2014-04-24 NOTE — H&P (Signed)
TOTAL HIP ADMISSION H&P  Patient is admitted for right total hip arthroplasty.  Subjective:  Chief Complaint: right hip pain  HPI: Jean Dean, 69 y.o. female, has a history of pain and functional disability in the right hip(s) due to arthritis and patient has failed non-surgical conservative treatments for greater than 12 weeks to include NSAID's and/or analgesics, flexibility and strengthening excercises, weight reduction as appropriate and activity modification.  Onset of symptoms was gradual starting 2 years ago with gradually worsening course since that time.The patient noted no past surgery on the right hip(s).  Patient currently rates pain in the right hip at 8 out of 10 with activity. Patient has night pain, worsening of pain with activity and weight bearing, pain that interfers with activities of daily living and pain with passive range of motion. Patient has evidence of subchondral sclerosis, periarticular osteophytes and joint space narrowing by imaging studies. This condition presents safety issues increasing the risk of falls.  There is no current active infection.  Patient Active Problem List   Diagnosis Date Noted  . Osteoarthritis of right hip 04/24/2014  . Tubular adenoma of colon 10/30/2013  . Chronic venous insufficiency 09/30/2013  . Back pain 03/24/2013  . Greater trochanteric bursitis of right hip 03/24/2013  . History of laparoscopic adjustable gastric banding, APS - 05/12/2008 12/22/2011  . Low back pain 01/02/2011  . Primary osteoarthritis of both knees 07/07/2010  . History of cervical dysplasia 07/07/2010  . DIVERTICULOSIS, COLON 01/08/2010  . SLEEP APNEA 10/12/2009  . Loss of weight 05/25/2009  . HYPERTENSION, BENIGN ESSENTIAL, MILD 01/27/2008  . HYPOTHYROIDISM, UNSPECIFIED 04/19/2006  . HYPERLIPIDEMIA 04/19/2006  . HEARING LOSS NOS OR DEAFNESS 04/19/2006  . FIBROMYALGIA, FIBROMYOSITIS 04/19/2006   Past Medical History  Diagnosis Date  . Herniated  nucleus pulposus, L4-5 1990    L4-L5 bulge-many yrs ago-not as much a problem now.  . Diverticulosis 07/22/2003    colonoscopy  . Coccydynia 1991  . 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 DVT (deep venous thrombosis)     superficial- left leg ,took Coumadin many years ago to tx., not an issue now  . Hypothyroidism   . Fibromyalgia   . Arthritis     osteoarthritis -right hip and knee  . Hepatitis B infection 1970    tx. -pt has positive antibodies    Past Surgical History  Procedure Laterality Date  . Breast biopsy  1969    fibroma on left  . Excision morton's neuroma  03/24/1967    right foot  . Vaginal hysterectomy  1973    abnormal pap, 4A dysplasia  . Vaginectomy  1974    x 2  . Breast reduction surgery  11/17/2005  . Total knee arthroplasty  11/17/2005  . Knee arthroscopy Bilateral 2008    '06 right/ '07 left  . Tonsillectomy    . Dilation and curettage of uterus    . Cholecystectomy  06/21/1998    laparoscopic  . Bunionectomy Left     2006  . Laparoscopic gastric banding      04-2008  . Varicose vein surgery      left leg 8'15, remains having procedures done- last 2-16 -16,wears compression hose    Prescriptions prior to admission  Medication Sig Dispense Refill Last Dose  . aspirin (ASPIRIN CHILDRENS) 81 MG chewable tablet Chew 1 tablet (81 mg total) by mouth daily. 36 tablet 11 Past Week at  Unknown time  . aspirin EC 325 MG tablet Take 325 mg by mouth every morning.   Past Week at Unknown time  . aspirin-acetaminophen-caffeine (EXCEDRIN MIGRAINE) 250-250-65 MG per tablet Take 1-2 tablets by mouth every 6 (six) hours as needed for headache.   Past Week at Unknown time  . atorvastatin (LIPITOR) 10 MG tablet take 1 tablet by mouth at bedtime (Patient taking differently: take 1 tablet by mouth every morning.) 90 tablet 1 04/24/2014 at 0500  . Cholecalciferol (VITAMIN D PO) Take 1 tablet by mouth every morning.   04/23/2014 at Unknown  time  . FIBER SELECT GUMMIES PO Take 2 each by mouth every morning.   04/23/2014 at Unknown time  . ibuprofen (ADVIL,MOTRIN) 200 MG tablet Take 400 mg by mouth every 6 (six) hours as needed for headache or moderate pain.   Past Week at Unknown time  . levothyroxine (SYNTHROID, LEVOTHROID) 150 MCG tablet take 1 tablet by mouth once daily 90 tablet 1 04/24/2014 at 0500  . Multiple Vitamins-Minerals (MULTIVITAMIN GUMMIES ADULT PO) Take 2 each by mouth every morning.    04/23/2014 at Unknown time  . omega-3 acid ethyl esters (LOVAZA) 1 G capsule Take 2 g by mouth 2 (two) times daily.   04/23/2014 at Unknown time  . PARoxetine (PAXIL) 20 MG tablet take 1 tablet by mouth every morning 90 tablet 3 04/24/2014 at 0500   Allergies  Allergen Reactions  . Clarithromycin     REACTION: rash    History  Substance Use Topics  . Smoking status: Former Smoker -- 13 years    Types: Cigarettes    Quit date: 07/06/1975  . Smokeless tobacco: Never Used  . Alcohol Use: Yes     Comment: rare social    Family History  Problem Relation Age of Onset  . Hypertension Brother   . Hyperlipidemia Brother   . Deep vein thrombosis Brother   . Stroke Father 11  . Hyperlipidemia Mother      Review of Systems  Musculoskeletal: Positive for joint pain.  All other systems reviewed and are negative.   Objective:  Physical Exam  Constitutional: She is oriented to person, place, and time. She appears well-developed and well-nourished.  HENT:  Head: Normocephalic and atraumatic.  Eyes: EOM are normal. Pupils are equal, round, and reactive to light.  Neck: Neck supple.  Cardiovascular: Normal rate and regular rhythm.   Respiratory: Effort normal and breath sounds normal.  GI: Soft. Bowel sounds are normal.  Musculoskeletal:       Right hip: She exhibits decreased range of motion, decreased strength and tenderness.  Neurological: She is alert and oriented to person, place, and time.  Skin: Skin is warm and dry.   Psychiatric: She has a normal mood and affect.    Vital signs in last 24 hours: Temp:  [97.5 F (36.4 C)] 97.5 F (36.4 C) (03/04 0542) Pulse Rate:  [90] 90 (03/04 0542) Resp:  [18] 18 (03/04 0542) BP: (130)/(88) 130/88 mmHg (03/04 0542) SpO2:  [99 %] 99 % (03/04 0542) Weight:  [112.946 kg (249 lb)] 112.946 kg (249 lb) (03/04 0602)  Labs:   Estimated body mass index is 32.86 kg/(m^2) as calculated from the following:   Height as of this encounter: 6\' 1"  (1.854 m).   Weight as of this encounter: 112.946 kg (249 lb).   Imaging Review Plain radiographs demonstrate severe degenerative joint disease of the right hip(s). The bone quality appears to be good for age and reported  activity level.  Assessment/Plan:  End stage arthritis, right hip(s)  The patient history, physical examination, clinical judgement of the provider and imaging studies are consistent with end stage degenerative joint disease of the right hip(s) and total hip arthroplasty is deemed medically necessary. The treatment options including medical management, injection therapy, arthroscopy and arthroplasty were discussed at length. The risks and benefits of total hip arthroplasty were presented and reviewed. The risks due to aseptic loosening, infection, stiffness, dislocation/subluxation,  thromboembolic complications and other imponderables were discussed.  The patient acknowledged the explanation, agreed to proceed with the plan and consent was signed. Patient is being admitted for inpatient treatment for surgery, pain control, PT, OT, prophylactic antibiotics, VTE prophylaxis, progressive ambulation and ADL's and discharge planning.The patient is planning to be discharged home with home health services

## 2014-04-24 NOTE — Anesthesia Postprocedure Evaluation (Signed)
  Anesthesia Post-op Note  Patient: Jean Dean  Procedure(s) Performed: Procedure(s) (LRB): RIGHT TOTAL HIP ARTHROPLASTY ANTERIOR APPROACH (Right)  Patient Location: PACU  Anesthesia Type: Spinal  Level of Consciousness: awake and alert   Airway and Oxygen Therapy: Patient Spontanous Breathing  Post-op Pain: mild  Post-op Assessment: Post-op Vital signs reviewed, Patient's Cardiovascular Status Stable, Respiratory Function Stable, Patent Airway and No signs of Nausea or vomiting  Last Vitals:  Filed Vitals:   04/24/14 1437  BP: 120/67  Pulse: 79  Temp: 37.4 C  Resp: 16    Post-op Vital Signs: stable   Complications: No apparent anesthesia complications

## 2014-04-24 NOTE — Brief Op Note (Signed)
04/24/2014  8:57 AM  PATIENT:  Jean Dean  69 y.o. female  PRE-OPERATIVE DIAGNOSIS:  osteoarthritis right hip  POST-OPERATIVE DIAGNOSIS:  osteoarthritis right hip  PROCEDURE:  Procedure(s): RIGHT TOTAL HIP ARTHROPLASTY ANTERIOR APPROACH (Right)  SURGEON:  Surgeon(s) and Role:    * Mcarthur Rossetti, MD - Primary  PHYSICIAN ASSISTANT: Benita Stabile, PA-C  ANESTHESIA:   spinal  EBL:  Total I/O In: 1000 [I.V.:1000] Out: 300 [Urine:100; Blood:200]  BLOOD ADMINISTERED:none  DRAINS: none   LOCAL MEDICATIONS USED:  NONE  SPECIMEN:  No Specimen  DISPOSITION OF SPECIMEN:  N/A  COUNTS:  YES  TOURNIQUET:  * No tourniquets in log *  DICTATION: .Other Dictation: Dictation Number [id number]  PLAN OF CARE: Admit to inpatient   PATIENT DISPOSITION:  PACU - hemodynamically stable.   Delay start of Pharmacological VTE agent (>24hrs) due to surgical blood loss or risk of bleeding: no

## 2014-04-24 NOTE — Anesthesia Procedure Notes (Addendum)
Procedure Name: MAC Date/Time: 04/24/2014 7:34 AM Performed by: Carleene Cooper A Pre-anesthesia Checklist: Patient identified, Timeout performed, Emergency Drugs available, Suction available and Patient being monitored Patient Re-evaluated:Patient Re-evaluated prior to inductionOxygen Delivery Method: Simple face mask Dental Injury: Teeth and Oropharynx as per pre-operative assessment    Spinal Patient location during procedure: OR Start time: 04/24/2014 7:40 AM End time: 04/24/2014 7:47 AM Staffing Resident/CRNA: Carleene Cooper A Performed by: resident/CRNA  Preanesthetic Checklist Completed: patient identified, site marked, surgical consent, pre-op evaluation, timeout performed, IV checked, risks and benefits discussed and monitors and equipment checked Spinal Block Patient position: sitting Prep: Betadine Patient monitoring: heart rate, continuous pulse ox and blood pressure Approach: midline Location: L2-3 Injection technique: single-shot Needle Needle type: Spinocan  Needle gauge: 22 G Needle length: 9 cm Assessment Sensory level: T4 Additional Notes Pt tolerated procedure well. Attempted x1 with 24 Sprotte. Second attempt with 22 Spinocan. + CSF, - heme. Spinal kit expiration date 2017-07

## 2014-04-24 NOTE — Anesthesia Preprocedure Evaluation (Addendum)
Anesthesia Evaluation  Patient identified by MRN, date of birth, ID band Patient awake    Reviewed: Allergy & Precautions, NPO status , Patient's Chart, lab work & pertinent test results  Airway Mallampati: II  TM Distance: >3 FB Neck ROM: Full    Dental no notable dental hx.    Pulmonary sleep apnea , former smoker,  breath sounds clear to auscultation  Pulmonary exam normal       Cardiovascular hypertension, + Peripheral Vascular Disease Rhythm:Regular Rate:Normal     Neuro/Psych Back pain  Neuromuscular disease negative psych ROS   GI/Hepatic negative GI ROS, (+) Hepatitis -, B  Endo/Other  Hypothyroidism   Renal/GU negative Renal ROS  negative genitourinary   Musculoskeletal  (+) Arthritis -, Fibromyalgia -  Abdominal (+) + obese,   Peds negative pediatric ROS (+)  Hematology negative hematology ROS (+)   Anesthesia Other Findings   Reproductive/Obstetrics negative OB ROS                           Anesthesia Physical Anesthesia Plan  ASA: II  Anesthesia Plan: Spinal   Post-op Pain Management:    Induction: Intravenous  Airway Management Planned:   Additional Equipment:   Intra-op Plan:   Post-operative Plan: Extubation in OR  Informed Consent: I have reviewed the patients History and Physical, chart, labs and discussed the procedure including the risks, benefits and alternatives for the proposed anesthesia with the patient or authorized representative who has indicated his/her understanding and acceptance.   Dental advisory given  Plan Discussed with: CRNA  Anesthesia Plan Comments: (Discussed spinal and general. Discussed risks/benefits of spinal including headache, backache, failure, bleeding, infection, and nerve damage. Patient consents to spinal. Questions answered. Coagulation studies and platelet count acceptable.)       Anesthesia Quick Evaluation

## 2014-04-25 LAB — BASIC METABOLIC PANEL
ANION GAP: 3 — AB (ref 5–15)
BUN: 14 mg/dL (ref 6–23)
CALCIUM: 8.2 mg/dL — AB (ref 8.4–10.5)
CHLORIDE: 103 mmol/L (ref 96–112)
CO2: 31 mmol/L (ref 19–32)
CREATININE: 0.71 mg/dL (ref 0.50–1.10)
GFR calc Af Amer: >90 mL/min (ref >90)
GFR, EST NON AFRICAN AMERICAN: 85 mL/min — AB (ref >90)
Glucose, Bld: 137 mg/dL — ABNORMAL HIGH (ref 70–99)
POTASSIUM: 4.3 mmol/L (ref 3.5–5.1)
Sodium: 137 mmol/L (ref 135–145)

## 2014-04-25 LAB — CBC
HEMATOCRIT: 30.9 % — AB (ref 36.0–46.0)
HEMOGLOBIN: 10 g/dL — AB (ref 12.0–15.0)
MCH: 29.1 pg (ref 26.0–34.0)
MCHC: 32.4 g/dL (ref 30.0–36.0)
MCV: 89.8 fL (ref 78.0–100.0)
PLATELETS: 122 10*3/uL — AB (ref 150–400)
RBC: 3.44 MIL/uL — ABNORMAL LOW (ref 3.87–5.11)
RDW: 14.8 % (ref 11.5–15.5)
WBC: 6.9 10*3/uL (ref 4.0–10.5)

## 2014-04-25 MED ORDER — TRAMADOL HCL 50 MG PO TABS: 50.0000 mg | ORAL_TABLET | Freq: Four times a day (QID) | ORAL | Status: DC | PRN

## 2014-04-25 MED ORDER — METHOCARBAMOL 500 MG PO TABS: 500.0000 mg | ORAL_TABLET | Freq: Four times a day (QID) | ORAL | Status: DC | PRN

## 2014-04-25 MED ORDER — ASPIRIN EC 325 MG PO TBEC: 325.0000 mg | DELAYED_RELEASE_TABLET | Freq: Two times a day (BID) | ORAL | Status: AC

## 2014-04-25 NOTE — Progress Notes (Signed)
Subjective: 1 Day Post-Op Procedure(s) (LRB): RIGHT TOTAL HIP ARTHROPLASTY ANTERIOR APPROACH (Right) Patient reports pain as moderate.    Objective: Vital signs in last 24 hours: Temp:  [97.4 F (36.3 C)-101.4 F (38.6 C)] 101.4 F (38.6 C) (03/05 0550) Pulse Rate:  [64-85] 85 (03/05 0550) Resp:  [9-18] 18 (03/05 0550) BP: (98-136)/(46-72) 122/55 mmHg (03/05 0550) SpO2:  [94 %-100 %] 100 % (03/05 0550)  Intake/Output from previous day: 03/04 0701 - 03/05 0700 In: 4321.3 [P.O.:600; I.V.:3671.3; IV Piggyback:50] Out: 950 [Urine:750; Blood:200] Intake/Output this shift:     Recent Labs  04/25/14 0411  HGB 10.0*    Recent Labs  04/25/14 0411  WBC 6.9  RBC 3.44*  HCT 30.9*  PLT 122*    Recent Labs  04/25/14 0411  NA 137  K 4.3  CL 103  CO2 31  BUN 14  CREATININE 0.71  GLUCOSE 137*  CALCIUM 8.2*   No results for input(s): LABPT, INR in the last 72 hours.  Intact pulses distally Dorsiflexion/Plantar flexion intact Incision: scant drainage Compartment soft  Assessment/Plan: 1 Day Post-Op Procedure(s) (LRB): RIGHT TOTAL HIP ARTHROPLASTY ANTERIOR APPROACH (Right) Up with therapy Discharge home with home health this afternoon.  Jean Dean Y 04/25/2014, 8:32 AM

## 2014-04-25 NOTE — Progress Notes (Signed)
Patient ID: Jean Dean, female   DOB: 07-18-1945, 69 y.o.   MRN: [id number] Jean Dean does have acute blood loss anemia from surgery and is asymptomatic with stable vitals.

## 2014-04-25 NOTE — Progress Notes (Signed)
CARE MANAGEMENT NOTE 04/25/2014  Patient:  Jean Dean, Jean Dean   Account Number:  [id number]  Date Initiated:  04/25/2014  Documentation initiated by:  Dr [facility]  Subjective/Objective Assessment:   Status post total replacement of right hip     Action/Plan:   Anticipated DC Date:  04/25/2014   Anticipated DC Plan:  [place]  CM consult      [facility] Choice  HOME HEALTH   Choice offered to / List presented to:  C-1 Patient        [place] arranged  HH-2 PT      [place]   Status of service:  Completed, signed off Medicare Important Message given?  NO (If response is "NO", the following Medicare IM given date fields will be blank) Date Medicare IM given:   Medicare IM given by:   Date Additional Medicare IM given:   Additional Medicare IM given by:    Discharge Disposition:  [place]  Per UR Regulation:    If discussed at Long Length of Stay Meetings, dates discussed:    Comments:  04/25/2014 1000 NCM spoke to pt and offered choice for [facility]. Pt agreeable to [facility] for [facility]. States she has RW and cane at home. States her dtr will be at home to assist with care. Jonnie Finner RN CCM Case Mgmt phone [phone]

## 2014-04-25 NOTE — Discharge Summary (Signed)
Patient ID: Jean Dean MRN: [id number] DOB/AGE: 1946-01-23 69 y.o.  Admit date: 04/24/2014 Discharge date: 04/25/2014  Admission Diagnoses:  Principal Problem:   Osteoarthritis of right hip Active Problems:   Status post total replacement of right hip   Discharge Diagnoses:  Same  Past Medical History  Diagnosis Date  . Herniated nucleus pulposus, L4-5 1990    L4-L5 bulge-many yrs ago-not as much a problem now.  . Diverticulosis 07/22/2003    colonoscopy  . Coccydynia 1991  . 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 DVT (deep venous thrombosis)     superficial- left leg ,took Coumadin many years ago to tx., not an issue now  . Hypothyroidism   . Fibromyalgia   . Arthritis     osteoarthritis -right hip and knee  . Hepatitis B infection 1970    tx. -pt has positive antibodies    Surgeries: Procedure(s): RIGHT TOTAL HIP ARTHROPLASTY ANTERIOR APPROACH on 04/24/2014   Consultants:    Discharged Condition: Improved  Hospital Course: Jean Dean is an 69 y.o. female who was admitted 04/24/2014 for operative treatment ofOsteoarthritis of right hip. Patient has severe unremitting pain that affects sleep, daily activities, and work/hobbies. After pre-op clearance the patient was taken to the operating room on 04/24/2014 and underwent  Procedure(s): RIGHT TOTAL HIP ARTHROPLASTY ANTERIOR APPROACH.    Patient was given perioperative antibiotics: Anti-infectives    Start     Dose/Rate Route Frequency Ordered Stop   04/24/14 1400  ceFAZolin (ANCEF) IVPB 1 g/50 mL premix     1 g 100 mL/hr over 30 Minutes Intravenous Every 6 hours 04/24/14 1149 04/24/14 2046   04/24/14 0558  ceFAZolin (ANCEF) IVPB 2 g/50 mL premix     2 g 100 mL/hr over 30 Minutes Intravenous On call to O.R. 04/24/14 4854 04/24/14 0749       Patient was given sequential compression devices, early ambulation, and chemoprophylaxis to prevent DVT.  Patient  benefited maximally from hospital stay and there were no complications.    Recent vital signs: Patient Vitals for the past 24 hrs:  BP Temp Temp src Pulse Resp SpO2  04/25/14 0550 (!) 122/55 mmHg (!) 101.4 F (38.6 C) Oral 85 18 100 %  04/25/14 0342 - - - - 18 -  04/25/14 0242 106/64 mmHg 97.6 F (36.4 C) Oral 80 16 97 %  04/25/14 0000 - - - - 17 94 %  04/24/14 2222 (!) 102/46 mmHg 100.3 F (37.9 C) Oral 72 16 97 %  04/24/14 2000 - - - - 18 95 %  04/24/14 1820 108/61 mmHg 98.7 F (37.1 C) Oral 85 16 98 %  04/24/14 1600 - - - - 16 97 %  04/24/14 1437 120/67 mmHg 99.3 F (37.4 C) Oral 79 16 100 %  04/24/14 1330 (!) 109/48 mmHg 97.8 F (36.6 C) Oral 78 16 99 %  04/24/14 1226 136/70 mmHg 97.5 F (36.4 C) Axillary 81 16 100 %  04/24/14 1200 - - - - 16 96 %  04/24/14 1125 131/72 mmHg 97.6 F (36.4 C) - 75 16 100 %  04/24/14 1115 119/64 mmHg - - 74 15 96 %  04/24/14 1100 119/66 mmHg 97.4 F (36.3 C) - 66 15 100 %  04/24/14 1045 115/66 mmHg 97.5 F (36.4 C) - 66 15 100 %  04/24/14 1030 112/68 mmHg 97.4 F (36.3 C) - 66 (!) 9 100 %  04/24/14 1015 114/68 mmHg 97.4 F (36.3 C) - 68 14 99 %  04/24/14 1000 (!) 109/51 mmHg - - 64 12 100 %  04/24/14 0945 110/64 mmHg - - 69 15 100 %  04/24/14 0930 (!) 107/58 mmHg - - 69 11 100 %  04/24/14 0923 98/65 mmHg 97.4 F (36.3 C) - 76 14 -     Recent laboratory studies:  Recent Labs  04/25/14 0411  WBC 6.9  HGB 10.0*  HCT 30.9*  PLT 122*  NA 137  K 4.3  CL 103  CO2 31  BUN 14  CREATININE 0.71  GLUCOSE 137*  CALCIUM 8.2*     Discharge Medications:     Medication List    STOP taking these medications        ibuprofen 200 MG tablet  Commonly known as:  ADVIL,MOTRIN      TAKE these medications        aspirin EC 325 MG tablet  Take 1 tablet (325 mg total) by mouth 2 (two) times daily after a meal.     aspirin-acetaminophen-caffeine 700-174-94 MG per tablet  Commonly known as:  EXCEDRIN MIGRAINE  Take 1-2 tablets by  mouth every 6 (six) hours as needed for headache.     atorvastatin 10 MG tablet  Commonly known as:  LIPITOR  take 1 tablet by mouth at bedtime     FIBER SELECT GUMMIES PO  Take 2 each by mouth every morning.     levothyroxine 150 MCG tablet  Commonly known as:  SYNTHROID, LEVOTHROID  take 1 tablet by mouth once daily     methocarbamol 500 MG tablet  Commonly known as:  ROBAXIN  Take 1 tablet (500 mg total) by mouth every 6 (six) hours as needed for muscle spasms.     MULTIVITAMIN GUMMIES ADULT PO  Take 2 each by mouth every morning.     omega-3 acid ethyl esters 1 G capsule  Commonly known as:  LOVAZA  Take 2 g by mouth 2 (two) times daily.     PARoxetine 20 MG tablet  Commonly known as:  PAXIL  take 1 tablet by mouth every morning     traMADol 50 MG tablet  Commonly known as:  ULTRAM  Take 1-2 tablets (50-100 mg total) by mouth every 6 (six) hours as needed for moderate pain.     VITAMIN D PO  Take 1 tablet by mouth every morning.        Diagnostic Studies: Dg Hip Port Unilat With Pelvis 1v Right  04/24/2014   CLINICAL DATA:  Status post right total hip replacement.  EXAM: RIGHT HIP (WITH PELVIS) 1 VIEW PORTABLE  COMPARISON:  None.  FINDINGS: Status post right total hip arthroplasty. The femoral and acetabular components appear to be well situated. No acute fracture or dislocation is noted.  IMPRESSION: Status post right total hip arthroplasty.   Electronically Signed   By: Marijo Conception, M.D.   On: 04/24/2014 10:33   Dg Hip Operative Unilat With Pelvis Right  04/24/2014   CLINICAL DATA:  Right hip replacement.  EXAM: OPERATIVE right HIP (WITH PELVIS IF PERFORMED) 2 VIEWS  TECHNIQUE: Fluoroscopic spot image(s) were submitted for interpretation post-operatively.  COMPARISON:  None.  FINDINGS: Total right hip replacement with good anatomic alignment on AP views. Hardware intact.  IMPRESSION: Total right hip replacement.  Good anatomic alignment on AP views.   Electronically  Signed   By: Marcello Moores  Register   On: 04/24/2014 09:10  Disposition: to home      Discharge Instructions    Call MD / Call 911    Complete by:  As directed   If you experience chest pain or shortness of breath, CALL 911 and be transported to the hospital emergency room.  If you develope a fever above 101 F, pus (white drainage) or increased drainage or redness at the wound, or calf pain, call your surgeon's office.     Constipation Prevention    Complete by:  As directed   Drink plenty of fluids.  Prune juice may be helpful.  You may use a stool softener, such as Colace (over the counter) 100 mg twice a day.  Use MiraLax (over the counter) for constipation as needed.     Diet - low sodium heart healthy    Complete by:  As directed      Discharge instructions    Complete by:  As directed   Increase your activities as comfort allows. Ice as needed for swelling. You can get your current hip dressing wet daily in the shower. Leave your current dressing on until your follow-up appointment.     Discharge patient    Complete by:  As directed      Increase activity slowly as tolerated    Complete by:  As directed               Signed: Mcarthur Rossetti 04/25/2014, 8:36 AM

## 2014-04-25 NOTE — Progress Notes (Signed)
Physical Therapy Treatment Patient Details Name: Jean Dean MRN: [id number] DOB: 1945/10/26 Today's Date: 04/25/2014    History of Present Illness R THR    PT Comments    POD # 1 2nd PT session.  assited with amb in hallway, practiced one step and returned to bed to perform THR TE's following HEP handout.  Instructed on proper tech and freq plus use of ICE after. Pt is ready for D/C to home.  Follow Up Recommendations  Home health PT     Equipment Recommendations  None recommended by PT    Recommendations for Other Services       Precautions / Restrictions Precautions Precautions: Fall Restrictions Weight Bearing Restrictions: No Other Position/Activity Restrictions: WBAT    Mobility  Bed Mobility Overal bed mobility: Needs Assistance Bed Mobility: Supine to Sit     Supine to sit: Min assist     General bed mobility comments: Min Assist to support R LE and increased time  Transfers Overall transfer level: Needs assistance Equipment used: Rolling walker (2 wheeled) Transfers: Sit to/from Stand Sit to Stand: Supervision         General transfer comment: <25% VC's on proper tech and safety with turns  Ambulation/Gait Ambulation/Gait assistance: Supervision;Min guard Ambulation Distance (Feet): 142 Feet Assistive device: Rolling walker (2 wheeled) Gait Pattern/deviations: Step-to pattern;Step-through pattern;Trunk flexed Gait velocity: decreased but functional   General Gait Details: increased time and 25% VC's safety with turns.  No c/o dizziness.  Feeling better.   Stairs            Wheelchair Mobility    Modified Rankin (Stroke Patients Only)       Balance                                    Cognition Arousal/Alertness: Awake/alert Behavior During Therapy: WFL for tasks assessed/performed Overall Cognitive Status: Within Functional Limits for tasks assessed                      Exercises   Total Hip  Replacement TE's 10 reps ankle pumps 10 reps knee presses 10 reps heel slides 10 reps SAQ's 10 reps ABD Followed by ICE     General Comments        Pertinent Vitals/Pain Pain Assessment: No/denies pain Pain Score: 6  Pain Location: R hip  Pain Descriptors / Indicators: Aching;Sore Pain Intervention(s): Monitored during session;Premedicated before session;Repositioned;Ice applied    Home Living Family/patient expects to be discharged to:: Private residence Living Arrangements: Alone Available Help at Discharge: Family;Friend(s);Available 24 hours/day Type of Home: House Home Access: Stairs to enter Entrance Stairs-Rails: None Home Layout: One level Home Equipment: Environmental consultant - 4 wheels;Cane - single point;Bedside commode      Prior Function Level of Independence: Independent          PT Goals (current goals can now be found in the care plan section) Acute Rehab PT Goals Patient Stated Goal: Resume previous lifestyle with decreased pain Progress towards PT goals: Progressing toward goals    Frequency  7X/week    PT Plan      Co-evaluation             End of Session Equipment Utilized During Treatment: Gait belt Activity Tolerance: Patient tolerated treatment well Patient left: in chair;with call bell/phone within reach     Time: 1004-1028 PT Time Calculation (min) (ACUTE ONLY): 24  min  Charges:  $Gait Training: 8-22 mins $Therapeutic Exercise: 8-22 mins                     G Codes:      Rica Koyanagi  PTA [facility]  Acute  Rehab Pager      [phone]

## 2014-04-25 NOTE — Progress Notes (Signed)
Physical Therapy Treatment Patient Details Name: Jean Dean MRN: [id number] DOB: Sep 02, 1945 Today's Date: 04/25/2014    History of Present Illness R THR    PT Comments    POD # 1 am session.  Assisted OOB to [facility] to void.  Pt required increased time and assist to support R LE.  Assisted off BSC then amb in hallway.  No c/o dizziness.  Pt did state she did not like the way her pain meds make her feel. Positioned in recliner. Pt will need another PT session to address TE's and stairs.  Pt hopes to D/C to home later today if she cont to feel fine.   Follow Up Recommendations  Home health PT     Equipment Recommendations  None recommended by PT    Recommendations for Other Services       Precautions / Restrictions Precautions Precautions: Fall Restrictions Weight Bearing Restrictions: No Other Position/Activity Restrictions: WBAT    Mobility  Bed Mobility Overal bed mobility: Needs Assistance Bed Mobility: Supine to Sit     Supine to sit: Min assist     General bed mobility comments: Min Assist to support R LE and increased time  Transfers Overall transfer level: Needs assistance Equipment used: Rolling walker (2 wheeled) Transfers: Sit to/from Stand Sit to Stand: Supervision         General transfer comment: <25% VC's on proper tech and safety with turns  Ambulation/Gait Ambulation/Gait assistance: Supervision;Min guard Ambulation Distance (Feet): 142 Feet Assistive device: Rolling walker (2 wheeled) Gait Pattern/deviations: Step-to pattern;Step-through pattern;Trunk flexed Gait velocity: decreased but functional   General Gait Details: increased time and 25% VC's safety with turns.  No c/o dizziness.  Feeling better.   Stairs            Wheelchair Mobility    Modified Rankin (Stroke Patients Only)       Balance                                    Cognition Arousal/Alertness: Awake/alert Behavior During Therapy: WFL  for tasks assessed/performed Overall Cognitive Status: Within Functional Limits for tasks assessed                      Exercises      General Comments  temp prior to activity 100.3  temp after amb 98.0      Pertinent Vitals/Pain Pain Assessment: 0-10 Pain Score: 6  Pain Location: R hip  Pain Descriptors / Indicators: Aching;Sore Pain Intervention(s): Monitored during session;Premedicated before session;Repositioned;Ice applied    Home Living                      Prior Function            PT Goals (current goals can now be found in the care plan section) Progress towards PT goals: Progressing toward goals    Frequency  7X/week    PT Plan      Co-evaluation             End of Session Equipment Utilized During Treatment: Gait belt Activity Tolerance: Patient tolerated treatment well Patient left: in chair;with call bell/phone within reach     Time: 0735-0808 PT Time Calculation (min) (ACUTE ONLY): 33 min  Charges:  $Gait Training: 8-22 mins $Therapeutic Activity: 8-22 mins  G Codes:      Rica Koyanagi  PTA [facility]  Acute  Rehab Pager      [phone]

## 2014-04-25 NOTE — Progress Notes (Signed)
Occupational Therapy Evaluation Patient Details Name: Jean Dean MRN: [id number] DOB: [birth year] Today's Date: 04/25/2014    History of Present Illness R THR   Clinical Impression   Patient discharging home today, will have family assist at home. All OT education completed. No further acute OT needs at this time.    Follow Up Recommendations  No OT follow up;Supervision/Assistance - 24 hour    Equipment Recommendations  None recommended by OT (has all needed equipment)    Recommendations for Other Services       Precautions / Restrictions Precautions Precautions: Fall Restrictions Weight Bearing Restrictions: No Other Position/Activity Restrictions: WBAT      Mobility Bed Mobility Overal bed mobility: Needs Assistance Bed Mobility: Supine to Sit     Supine to sit: Min assist     General bed mobility comments: Min Assist to support R LE and increased time  Transfers Overall transfer level: Needs assistance Equipment used: Rolling walker (2 wheeled) Transfers: Sit to/from Stand Sit to Stand: Supervision         General transfer comment: <25% VC's on proper tech and safety with turns    Balance                                            ADL Overall ADL's : Needs assistance/impaired Eating/Feeding: Independent;Sitting   Grooming: Wash/dry hands;Set up;Supervision/safety;Sitting;Standing   Upper Body Bathing: Set up;Sitting   Lower Body Bathing: Minimal assistance;Sit to/from stand   Upper Body Dressing : Minimal assistance;Sitting   Lower Body Dressing: Moderate assistance;Sit to/from stand (will have daughter assist her)   Toilet Transfer: Supervision/safety;Ambulation;BSC;RW   Toileting- Clothing Manipulation and Hygiene: Supervision/safety;Sit to/from stand   Tub/ Shower Transfer: Walk-in shower;Supervision/safety;Cueing for sequencing;Rolling walker   Functional mobility during ADLs: Supervision/safety;Rolling  walker General ADL Comments: Patient educated on LB bathing/dressing techniques. Patient reports her daughter will assist as needed and she is familiar with techniques from prior knee surgery.      Vision     Perception     Praxis      Pertinent Vitals/Pain Pain Assessment: No/denies pain Pain Score: 6  Pain Location: R hip  Pain Descriptors / Indicators: Aching;Sore Pain Intervention(s): Monitored during session;Premedicated before session;Repositioned;Ice applied     Hand Dominance Right   Extremity/Trunk Assessment Upper Extremity Assessment Upper Extremity Assessment: Overall WFL for tasks assessed   Lower Extremity Assessment Lower Extremity Assessment: Defer to PT evaluation   Cervical / Trunk Assessment Cervical / Trunk Assessment: Normal   Communication Communication Communication: HOH   Cognition Arousal/Alertness: Awake/alert Behavior During Therapy: WFL for tasks assessed/performed Overall Cognitive Status: Within Functional Limits for tasks assessed                     General Comments       Exercises       Shoulder Instructions      Home Living Family/patient expects to be discharged to:: Private residence Living Arrangements: Alone Available Help at Discharge: Family;Friend(s);Available 24 hours/day Type of Home: House Home Access: Stairs to enter [employer] of Steps: 1+1 Entrance Stairs-Rails: None Home Layout: One level     Bathroom Shower/Tub: Occupational psychologist: Standard Bathroom Accessibility: Yes How Accessible: Accessible via walker Home Equipment: [place] - 4 wheels;Cane - single point;Bedside commode          Prior Functioning/Environment  Level of Independence: Independent             OT Diagnosis: Acute pain   OT Problem List: Decreased range of motion;Decreased strength;Pain   OT Treatment/Interventions:      OT Goals(Current goals can be found in the care plan section) Acute  Rehab OT Goals Patient Stated Goal: Resume previous lifestyle with decreased pain OT Goal Formulation: All assessment and education complete, DC therapy  OT Frequency:     Barriers to D/C:            Co-evaluation              End of Session Equipment Utilized During Treatment: Rolling walker  Activity Tolerance: Patient tolerated treatment well Patient left: Other (comment) (in care of PT ambulating in hallway with RW)   Time: 5573-2202 OT Time Calculation (min): 12 min Charges:  OT General Charges $OT Visit: 1 Procedure OT Evaluation $Initial OT Evaluation Tier I: 1 Procedure G-Codes:    Tasharra Nodine A 2014/04/28, 10:42 AM

## 2014-07-16 ENCOUNTER — Other Ambulatory Visit: Payer: Self-pay | Admitting: Family Medicine

## 2014-08-17 ENCOUNTER — Other Ambulatory Visit: Payer: Self-pay

## 2015-01-08 ENCOUNTER — Other Ambulatory Visit: Payer: Self-pay | Admitting: Family Medicine

## 2015-05-27 ENCOUNTER — Encounter: Payer: Self-pay | Admitting: Family Medicine

## 2015-05-28 ENCOUNTER — Other Ambulatory Visit: Payer: Self-pay | Admitting: Family Medicine

## 2015-05-28 DIAGNOSIS — E785 Hyperlipidemia, unspecified: Secondary | ICD-10-CM

## 2015-05-28 MED ORDER — ATORVASTATIN CALCIUM 20 MG PO TABS: 20.0000 mg | ORAL_TABLET | Freq: Every day | ORAL | Status: DC

## 2015-05-28 NOTE — Assessment & Plan Note (Signed)
Patient reports [facility] of 220 ([facility]). -increased Lipitor to 20 mg daily

## 2015-07-02 ENCOUNTER — Other Ambulatory Visit: Payer: Self-pay | Admitting: Family Medicine

## 2015-11-01 DIAGNOSIS — E2839 Other primary ovarian failure: Secondary | ICD-10-CM | POA: Insufficient documentation

## 2015-11-01 NOTE — Progress Notes (Addendum)
70 y.o. year old female presents for well woman/preventative visit. Of note she is part of the [facility].   Acute Concerns: Patellar fracture - Memorial day weekend, wore brace for a month (pain is controlled), needs TKA per ortho Headaches/neck pain - 2-3 times per week, associated with looking at computer all day, relieved with Excedrin migraine  Diet: favorite foods ice cream and cheese, 1-2 fruits per day, 3-4 vegetables, 2-3 dairy products, takes multivitamin and calcium  Exercise: Not exercising much due to recent knee injury  Sexual/Birth History: No current partner, no recent sexual activity  Birth Control: Post Hysterectomy  POA/Living Will: Power of [place] (Daughter - Yelitza Betro), has Will, Does not want to be kept alive on life support, believes in assisted suicide  Social:  Social History   Social History  . Marital status: Divorced    Spouse name: N/A  . Number of children: 1  . Years of education: 38   Occupational History  .  [employer]   Social History Main Topics  . Smoking status: Former Smoker    Years: 13.00    Types: Cigarettes    Quit date: 07/06/1975  . Smokeless tobacco: Never Used  . Alcohol use Yes     Comment: rare social  . Drug use: No  . Sexual activity: Not Asked   Other Topics Concern  . None   Social History Narrative   Divorced   One daughter now 62, single, living in [place]. Area.   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     Immunization: Immunization History  Administered Date(s) Administered  . Influenza Whole 12/19/2006, 01/27/2008, 11/23/2008  . Influenza-Unspecified 11/20/2012  . Pneumococcal Polysaccharide-23 05/25/2009  . Td 01/20/2001  Due for shingles, Hep C screen, 13-valant PNA, Tdap, and Influenza  Cancer Screening:  Pap Smear: Not a candidate (history of hysterectomy)  Mammogram: Completed 02/2014 (BiRads 1)  Colonoscopy: Completed 09/2013 (single tubular  adenoma)  Dexa:Has not completed    Physical Exam: VITALS: Reviewed GEN: Pleasant female, NAD HEENT: Normocephalic, PERRL, EOMI, no scleral icterus, bilateral TM pearly grey, nasal septum midline, MMM, uvula midline, no anterior or posterior lymphadenopathy, no thyromegaly CARDIAC:RRR, S1 and S2 present, no murmur, no heaves/thrills RESP: CTAB, normal effort BREAST:Exam performed in the presence of a chaperone. No masses. No nipple discharge. No axillary lymphadenopathy. ABD: soft, no tenderness, normal bowel sounds GU/GYN:Exam performed in the presence of a chaperone. Normal external genitalia. Cervix unremarkable. Bimanual exam identified no masses EXT: No edema, 2+ radial and DP pulses SKIN: no rash  ASSESSMENT & PLAN: 70 y.o. female presents for annual well woman/preventative exam. Please see problem specific assessment and plan.   Estrogen deficiency Due for DEXA scan. Ordered today.  Preventative health care 70 y/o female presents for preventative care. -given flu, TdaP, 13-valent PNA today -gave shingles vaccine prescription -encouraged patient to get mammogram -not a candidate for pap smear due to hysterectomy -up to date on colonoscopy -DEXA scan ordered -routine labs (BMP, CBC, TSH, Lipids, Hep C screen)  Hypothyroidism Check TSH today.   HYPERTENSION, BENIGN ESSENTIAL, MILD Controlled. No current BP medications.

## 2015-11-01 NOTE — Assessment & Plan Note (Addendum)
Due for DEXA scan.  Ordered today. 

## 2015-11-04 ENCOUNTER — Ambulatory Visit (INDEPENDENT_AMBULATORY_CARE_PROVIDER_SITE_OTHER): Payer: BC Managed Care – PPO | Admitting: Family Medicine

## 2015-11-04 ENCOUNTER — Encounter: Payer: Self-pay | Admitting: Family Medicine

## 2015-11-04 VITALS — BP 114/71 | HR 87 | Temp 97.5°F | Wt 236.6 lb

## 2015-11-04 DIAGNOSIS — Z23 Encounter for immunization: Secondary | ICD-10-CM

## 2015-11-04 DIAGNOSIS — Z0000 Encounter for general adult medical examination without abnormal findings: Secondary | ICD-10-CM

## 2015-11-04 DIAGNOSIS — I10 Essential (primary) hypertension: Secondary | ICD-10-CM | POA: Diagnosis not present

## 2015-11-04 DIAGNOSIS — E2839 Other primary ovarian failure: Secondary | ICD-10-CM | POA: Diagnosis not present

## 2015-11-04 DIAGNOSIS — E039 Hypothyroidism, unspecified: Secondary | ICD-10-CM

## 2015-11-04 LAB — CBC WITH DIFFERENTIAL/PLATELET
Basophils Absolute: 0 cells/uL (ref 0–200)
Basophils Relative: 0 %
Eosinophils Absolute: 288 cells/uL (ref 15–500)
Eosinophils Relative: 6 %
HEMATOCRIT: 39.6 % (ref 35.0–45.0)
Hemoglobin: 13.2 g/dL (ref 11.7–15.5)
LYMPHS ABS: 1296 {cells}/uL (ref 850–3900)
LYMPHS PCT: 27 %
MCH: 28.8 pg (ref 27.0–33.0)
MCHC: 33.3 g/dL (ref 32.0–36.0)
MCV: 86.5 fL (ref 80.0–100.0)
MONO ABS: 528 {cells}/uL (ref 200–950)
MPV: 10.7 fL (ref 7.5–12.5)
Monocytes Relative: 11 %
NEUTROS ABS: 2688 {cells}/uL (ref 1500–7800)
NEUTROS PCT: 56 %
Platelets: 178 10*3/uL (ref 140–400)
RBC: 4.58 MIL/uL (ref 3.80–5.10)
RDW: 14.6 % (ref 11.0–15.0)
WBC: 4.8 10*3/uL (ref 3.8–10.8)

## 2015-11-04 LAB — BASIC METABOLIC PANEL WITH GFR
BUN: 24 mg/dL (ref 7–25)
CHLORIDE: 101 mmol/L (ref 98–110)
CO2: 30 mmol/L (ref 20–31)
CREATININE: 0.83 mg/dL (ref 0.60–0.93)
Calcium: 9.9 mg/dL (ref 8.6–10.4)
GFR, EST NON AFRICAN AMERICAN: 71 mL/min (ref >=60)
GFR, Est African American: 81 mL/min (ref >=60)
Glucose, Bld: 120 mg/dL — ABNORMAL HIGH (ref 65–99)
POTASSIUM: 4 mmol/L (ref 3.5–5.3)
SODIUM: 142 mmol/L (ref 135–146)

## 2015-11-04 LAB — LIPID PANEL
Cholesterol: 254 mg/dL — ABNORMAL HIGH (ref 125–200)
HDL: 61 mg/dL (ref >=46)
LDL CALC: 137 mg/dL — AB (ref <130)
TRIGLYCERIDES: 278 mg/dL — AB (ref <150)
Total CHOL/HDL Ratio: 4.2 Ratio (ref <=5.0)
VLDL: 56 mg/dL — ABNORMAL HIGH (ref <30)

## 2015-11-04 LAB — TSH: TSH: 12.24 m[IU]/L — AB

## 2015-11-04 NOTE — Assessment & Plan Note (Signed)
69 y/o female presents for preventative care. -given flu, TdaP, 13-valent PNA today -gave shingles vaccine prescription -encouraged patient to get mammogram -not a candidate for pap smear due to hysterectomy -up to date on colonoscopy -DEXA scan ordered -routine labs (BMP, CBC, TSH, Lipids, Hep C screen)

## 2015-11-04 NOTE — Assessment & Plan Note (Signed)
Controlled. No current BP medications.

## 2015-11-04 NOTE — Assessment & Plan Note (Signed)
Check TSH today

## 2015-11-04 NOTE — Addendum Note (Signed)
Addended by: Lianne Bushy on: 11/04/2015 09:45 AM   Modules accepted: Orders

## 2015-11-04 NOTE — Patient Instructions (Addendum)
It was nice to see you today.  Vaccines -you were given TdaP, Influenza, and 13-Valent pneumonia vaccine today; please go to your pharmacy to get the shingles vaccine  Dexa Scan - you are due for a bone density test, my nurse will schedule this for you  Mammogram - please schedule  Labs - The following labs have been checked today: Hepatitis C, Basic Metabolic Panel, Complete Blood Cell Count, Lipid Profile, and Thyroid Stimulating Hormone.   Please call my office with the calcium and vitamin D amounts.

## 2015-11-04 NOTE — Addendum Note (Signed)
Addended by: Junious Dresser on: 11/04/2015 09:52 AM   Modules accepted: Orders

## 2015-11-05 ENCOUNTER — Telehealth: Payer: Self-pay | Admitting: Family Medicine

## 2015-11-05 DIAGNOSIS — R7309 Other abnormal glucose: Secondary | ICD-10-CM

## 2015-11-05 DIAGNOSIS — D649 Anemia, unspecified: Secondary | ICD-10-CM

## 2015-11-05 DIAGNOSIS — E039 Hypothyroidism, unspecified: Secondary | ICD-10-CM

## 2015-11-05 LAB — HEPATITIS C ANTIBODY: HCV AB: NEGATIVE

## 2015-11-05 NOTE — Telephone Encounter (Signed)
Attempted to call,no answer, left message stating we would call back later today.

## 2015-11-08 ENCOUNTER — Encounter: Payer: Self-pay | Admitting: Family Medicine

## 2015-11-08 DIAGNOSIS — D649 Anemia, unspecified: Secondary | ICD-10-CM | POA: Insufficient documentation

## 2015-11-08 DIAGNOSIS — R7309 Other abnormal glucose: Secondary | ICD-10-CM | POA: Insufficient documentation

## 2015-11-08 MED ORDER — LEVOTHYROXINE SODIUM 175 MCG PO TABS: 175.0000 ug | ORAL_TABLET | Freq: Every day | ORAL | 2 refills | Status: DC

## 2015-11-08 NOTE — Assessment & Plan Note (Signed)
Hemoglobin 10.0. No active bleeding. Had colonoscopy in 2015 which showed tubular adenoma that was resected.  -recheck in 6 weeks -if continues to drop will need to work up further

## 2015-11-08 NOTE — Telephone Encounter (Signed)
Sent results via [facility].

## 2015-11-08 NOTE — Assessment & Plan Note (Signed)
TSH elevated. Increase Synthroid to 175 mcg. Recheck TSH in 6 weeks.

## 2015-11-08 NOTE — Assessment & Plan Note (Signed)
Elevated random blood glucose. -recheck level while fasting.

## 2015-11-08 NOTE — Telephone Encounter (Signed)
Attempted to call patient with results, straight to voicemail.

## 2015-11-09 ENCOUNTER — Other Ambulatory Visit: Payer: Self-pay | Admitting: Family Medicine

## 2015-11-09 ENCOUNTER — Encounter: Payer: Self-pay | Admitting: Family Medicine

## 2015-11-09 DIAGNOSIS — E2839 Other primary ovarian failure: Secondary | ICD-10-CM

## 2015-11-09 NOTE — Progress Notes (Signed)
Updated Calcium and Vitamin D dose (unable to find correct dose in EPIC, updated overview section)

## 2015-11-11 ENCOUNTER — Other Ambulatory Visit: Payer: Self-pay | Admitting: Family Medicine

## 2015-11-11 ENCOUNTER — Ambulatory Visit
Admission: RE | Admit: 2015-11-11 | Discharge: 2015-11-11 | Disposition: A | Discharge status: Home / Self Care | Payer: BC Managed Care – PPO | Source: Ambulatory Visit | Attending: Family Medicine | Admitting: Family Medicine

## 2015-11-11 DIAGNOSIS — Z1231 Encounter for screening mammogram for malignant neoplasm of breast: Secondary | ICD-10-CM

## 2015-11-11 DIAGNOSIS — E2839 Other primary ovarian failure: Secondary | ICD-10-CM

## 2015-11-12 ENCOUNTER — Encounter: Payer: Self-pay | Admitting: Family Medicine

## 2015-11-20 ENCOUNTER — Other Ambulatory Visit: Payer: Self-pay | Admitting: Family Medicine

## 2015-11-26 ENCOUNTER — Encounter (HOSPITAL_COMMUNITY): Payer: Self-pay

## 2015-12-02 ENCOUNTER — Ambulatory Visit (INDEPENDENT_AMBULATORY_CARE_PROVIDER_SITE_OTHER): Payer: BC Managed Care – PPO | Admitting: Orthopaedic Surgery

## 2015-12-02 DIAGNOSIS — S82034D Nondisplaced transverse fracture of right patella, subsequent encounter for closed fracture with routine healing: Secondary | ICD-10-CM

## 2016-01-03 ENCOUNTER — Encounter: Payer: Self-pay | Admitting: Family Medicine

## 2016-01-05 ENCOUNTER — Other Ambulatory Visit: Payer: BC Managed Care – PPO

## 2016-01-05 DIAGNOSIS — R7309 Other abnormal glucose: Secondary | ICD-10-CM

## 2016-01-05 DIAGNOSIS — R739 Hyperglycemia, unspecified: Secondary | ICD-10-CM

## 2016-01-05 DIAGNOSIS — E039 Hypothyroidism, unspecified: Secondary | ICD-10-CM

## 2016-01-05 DIAGNOSIS — D649 Anemia, unspecified: Secondary | ICD-10-CM

## 2016-01-05 LAB — CBC
HEMATOCRIT: 37.4 % (ref 35.0–45.0)
Hemoglobin: 12.6 g/dL (ref 11.7–15.5)
MCH: 29.6 pg (ref 27.0–33.0)
MCHC: 33.7 g/dL (ref 32.0–36.0)
MCV: 87.8 fL (ref 80.0–100.0)
MPV: 10.5 fL (ref 7.5–12.5)
Platelets: 186 10*3/uL (ref 140–400)
RBC: 4.26 MIL/uL (ref 3.80–5.10)
RDW: 15.3 % — AB (ref 11.0–15.0)
WBC: 6.8 10*3/uL (ref 3.8–10.8)

## 2016-01-05 LAB — BASIC METABOLIC PANEL WITH GFR
BUN: 26 mg/dL — ABNORMAL HIGH (ref 7–25)
CALCIUM: 10 mg/dL (ref 8.6–10.4)
CO2: 30 mmol/L (ref 20–31)
Chloride: 101 mmol/L (ref 98–110)
Creat: 0.83 mg/dL (ref 0.60–0.93)
GFR, EST AFRICAN AMERICAN: 81 mL/min (ref >=60)
GFR, EST NON AFRICAN AMERICAN: 71 mL/min (ref >=60)
GLUCOSE: 97 mg/dL (ref 65–99)
POTASSIUM: 4.3 mmol/L (ref 3.5–5.3)
SODIUM: 140 mmol/L (ref 135–146)

## 2016-01-05 LAB — TSH: TSH: 3.49 m[IU]/L

## 2016-01-10 ENCOUNTER — Other Ambulatory Visit: Payer: Self-pay | Admitting: Family Medicine

## 2016-05-29 ENCOUNTER — Other Ambulatory Visit: Payer: Self-pay | Admitting: Family Medicine

## 2016-08-03 ENCOUNTER — Other Ambulatory Visit: Payer: Self-pay | Admitting: Family Medicine

## 2016-09-04 IMAGING — RF DG HIP (WITH PELVIS) OPERATIVE*R*
1 series · 2 of 2 positions shown · non-contrast
Comparison: None.

CLINICAL DATA: Right hip replacement.

EXAM:
OPERATIVE right HIP (WITH PELVIS IF PERFORMED) 2 VIEWS
TECHNIQUE: Fluoroscopic spot image(s) were submitted for interpretation
post-operatively.

[Series 1: run · 2 of 2 slices shown]
[im 1/2]
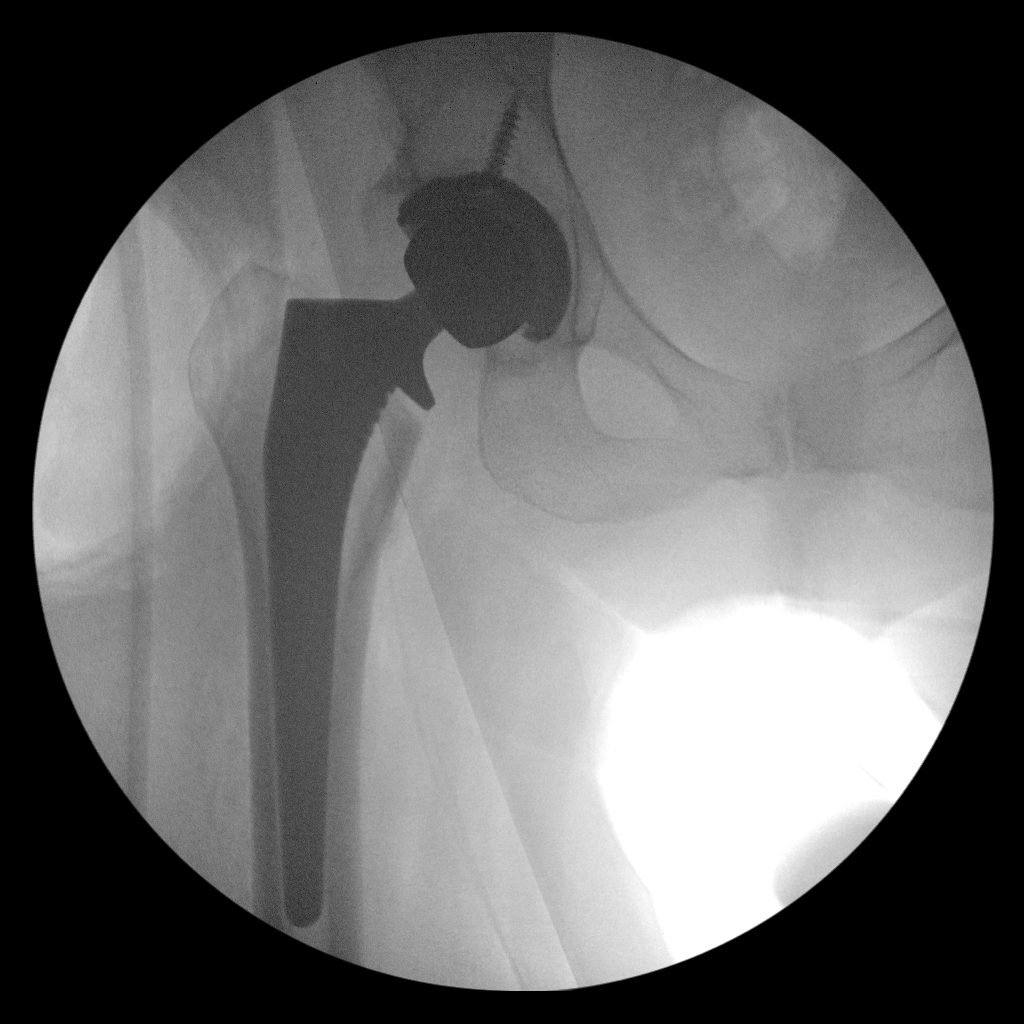
[im 2/2]
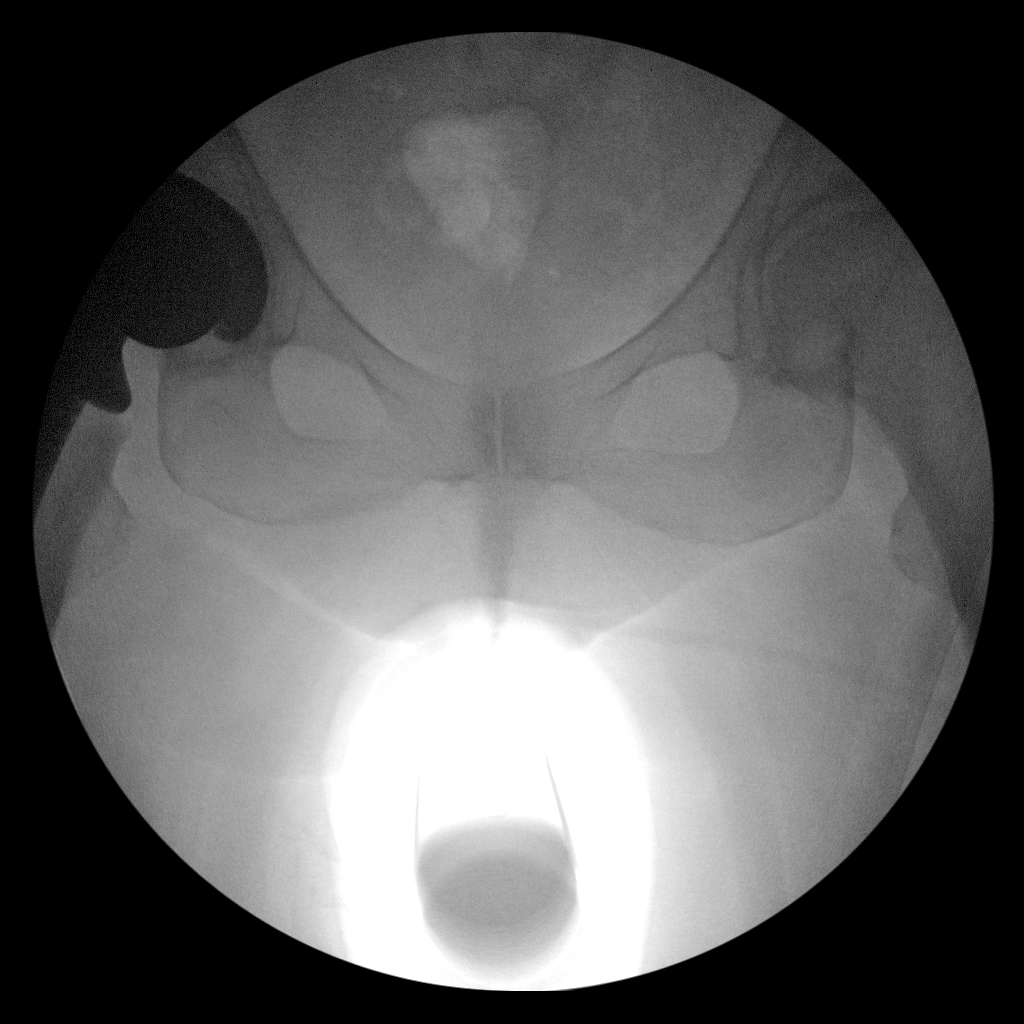

[2 of 2 positions shown; findings below may reference images not displayed]

FINDINGS: Total right hip replacement with good anatomic alignment on AP
views. Hardware intact.
IMPRESSION: Total right hip replacement.  Good anatomic alignment on AP views.

## 2016-09-26 ENCOUNTER — Other Ambulatory Visit: Payer: Self-pay | Admitting: Family Medicine

## 2016-09-26 DIAGNOSIS — Z1231 Encounter for screening mammogram for malignant neoplasm of breast: Secondary | ICD-10-CM

## 2016-11-13 ENCOUNTER — Ambulatory Visit: Payer: BC Managed Care – PPO

## 2016-11-21 ENCOUNTER — Ambulatory Visit
Admission: RE | Admit: 2016-11-21 | Discharge: 2016-11-21 | Disposition: A | Discharge status: Home / Self Care | Payer: Medicare Other | Source: Ambulatory Visit | Attending: Family Medicine | Admitting: Family Medicine

## 2016-11-21 DIAGNOSIS — Z1231 Encounter for screening mammogram for malignant neoplasm of breast: Secondary | ICD-10-CM

## 2016-11-24 ENCOUNTER — Encounter: Payer: Self-pay | Admitting: Family Medicine

## 2016-11-29 ENCOUNTER — Telehealth: Payer: Self-pay

## 2016-11-29 DIAGNOSIS — H9190 Unspecified hearing loss, unspecified ear: Secondary | ICD-10-CM

## 2016-11-29 NOTE — Telephone Encounter (Signed)
Patient is at [facility] and wants a referral for an audiology evaluation. Please fax this referral to [phone] Attention: Dorian Pod.[name]

## 2016-11-29 NOTE — Telephone Encounter (Signed)
Audiology referral entered. 

## 2016-12-01 NOTE — Telephone Encounter (Signed)
Referral information printed and faxed to number provided. Jazmin Hartsell,CMA

## 2016-12-12 ENCOUNTER — Encounter (HOSPITAL_COMMUNITY): Payer: Self-pay

## 2017-01-23 ENCOUNTER — Other Ambulatory Visit: Payer: Self-pay | Admitting: Family Medicine

## 2017-01-23 MED ORDER — PAROXETINE HCL 20 MG PO TABS: 20.0000 mg | ORAL_TABLET | Freq: Every morning | ORAL | 3 refills | Status: AC
